# Patient Record
Sex: Female | Born: 1985
Health system: Southern US, Community
[De-identification: ages and names within clinical notes are randomized; demographics above are authoritative.]

## PROBLEM LIST (undated history)

## (undated) DIAGNOSIS — G43909 Migraine, unspecified, not intractable, without status migrainosus: Secondary | ICD-10-CM

## (undated) DIAGNOSIS — M1990 Unspecified osteoarthritis, unspecified site: Secondary | ICD-10-CM

## (undated) DIAGNOSIS — D649 Anemia, unspecified: Secondary | ICD-10-CM

## (undated) DIAGNOSIS — C801 Malignant (primary) neoplasm, unspecified: Secondary | ICD-10-CM

## (undated) DIAGNOSIS — F32A Depression, unspecified: Secondary | ICD-10-CM

## (undated) DIAGNOSIS — K529 Noninfective gastroenteritis and colitis, unspecified: Secondary | ICD-10-CM

## (undated) DIAGNOSIS — E119 Type 2 diabetes mellitus without complications: Secondary | ICD-10-CM

## (undated) DIAGNOSIS — F419 Anxiety disorder, unspecified: Secondary | ICD-10-CM

## (undated) DIAGNOSIS — T7840XA Allergy, unspecified, initial encounter: Secondary | ICD-10-CM

## (undated) HISTORY — DX: Allergy, unspecified, initial encounter: T78.40XA

## (undated) HISTORY — PX: TUBAL LIGATION: SHX77

## (undated) HISTORY — DX: Anxiety disorder, unspecified: F41.9

## (undated) HISTORY — DX: Type 2 diabetes mellitus without complications: E11.9

## (undated) HISTORY — DX: Depression, unspecified: F32.A

## (undated) HISTORY — DX: Anemia, unspecified: D64.9

## (undated) HISTORY — PX: DILATION AND CURETTAGE OF UTERUS: SHX78

## (undated) HISTORY — DX: Unspecified osteoarthritis, unspecified site: M19.90

## (undated) HISTORY — PX: HERNIA REPAIR: SHX51

---

## 2004-04-24 ENCOUNTER — Inpatient Hospital Stay (HOSPITAL_COMMUNITY): Admission: AD | Admit: 2004-04-24 | Discharge: 2004-04-24 | Payer: Self-pay | Admitting: Obstetrics and Gynecology

## 2004-04-30 ENCOUNTER — Ambulatory Visit: Payer: Self-pay

## 2004-09-23 ENCOUNTER — Emergency Department: Payer: Self-pay | Admitting: Emergency Medicine

## 2004-12-06 ENCOUNTER — Emergency Department: Payer: Self-pay | Admitting: Emergency Medicine

## 2005-01-29 ENCOUNTER — Emergency Department: Payer: Self-pay | Admitting: Internal Medicine

## 2005-06-02 DIAGNOSIS — N879 Dysplasia of cervix uteri, unspecified: Secondary | ICD-10-CM

## 2005-06-02 HISTORY — PX: LEEP: SHX91

## 2005-06-02 HISTORY — DX: Dysplasia of cervix uteri, unspecified: N87.9

## 2005-11-19 ENCOUNTER — Ambulatory Visit: Payer: Self-pay | Admitting: Family Medicine

## 2006-01-12 ENCOUNTER — Emergency Department: Payer: Self-pay | Admitting: Emergency Medicine

## 2006-01-19 ENCOUNTER — Emergency Department: Payer: Self-pay | Admitting: Emergency Medicine

## 2006-02-26 ENCOUNTER — Ambulatory Visit: Payer: Self-pay | Admitting: Unknown Physician Specialty

## 2008-05-03 ENCOUNTER — Emergency Department: Payer: Self-pay | Admitting: Emergency Medicine

## 2008-07-09 ENCOUNTER — Emergency Department: Payer: Self-pay | Admitting: Emergency Medicine

## 2012-04-12 ENCOUNTER — Emergency Department: Payer: Self-pay | Admitting: Emergency Medicine

## 2012-04-21 DIAGNOSIS — M545 Low back pain, unspecified: Secondary | ICD-10-CM | POA: Insufficient documentation

## 2012-04-21 DIAGNOSIS — M533 Sacrococcygeal disorders, not elsewhere classified: Secondary | ICD-10-CM | POA: Insufficient documentation

## 2012-09-15 ENCOUNTER — Emergency Department: Payer: Self-pay | Admitting: Emergency Medicine

## 2012-09-15 LAB — URINALYSIS, COMPLETE
Bilirubin,UR: NEGATIVE
Blood: NEGATIVE
Glucose,UR: NEGATIVE mg/dL (ref 0–75)
Ketone: NEGATIVE
Nitrite: NEGATIVE
Ph: 8 (ref 4.5–8.0)
Protein: NEGATIVE
RBC,UR: 1 /HPF (ref 0–5)
Specific Gravity: 1.024 (ref 1.003–1.030)
Squamous Epithelial: 10
WBC UR: 4 /HPF (ref 0–5)

## 2012-09-15 LAB — HCG, QUANTITATIVE, PREGNANCY: Beta Hcg, Quant.: 134722 m[IU]/mL — ABNORMAL HIGH

## 2013-01-17 ENCOUNTER — Encounter: Payer: Self-pay | Admitting: Maternal and Fetal Medicine

## 2013-02-09 ENCOUNTER — Observation Stay: Payer: Self-pay

## 2013-02-09 LAB — URINALYSIS, COMPLETE
Bilirubin,UR: NEGATIVE
Blood: NEGATIVE
Glucose,UR: NEGATIVE mg/dL (ref 0–75)
Ketone: NEGATIVE
Nitrite: NEGATIVE
Ph: 6 (ref 4.5–8.0)
Protein: NEGATIVE
RBC,UR: <1 /HPF (ref 0–5)
Specific Gravity: 1.018 (ref 1.003–1.030)
Squamous Epithelial: 5
WBC UR: 2 /HPF (ref 0–5)

## 2013-02-09 LAB — FETAL FIBRONECTIN
Appearance: NORMAL
Fetal Fibronectin: NEGATIVE

## 2013-02-09 LAB — GC/CHLAMYDIA PROBE AMP

## 2013-02-10 ENCOUNTER — Ambulatory Visit: Payer: Self-pay | Admitting: Obstetrics and Gynecology

## 2013-04-06 ENCOUNTER — Emergency Department: Payer: Self-pay | Admitting: Emergency Medicine

## 2013-04-19 ENCOUNTER — Observation Stay: Payer: Self-pay | Admitting: Obstetrics and Gynecology

## 2013-04-19 LAB — PIH PROFILE
Anion Gap: 9 (ref 7–16)
BUN: 4 mg/dL — ABNORMAL LOW (ref 7–18)
Calcium, Total: 8.3 mg/dL — ABNORMAL LOW (ref 8.5–10.1)
Chloride: 106 mmol/L (ref 98–107)
Co2: 22 mmol/L (ref 21–32)
Creatinine: 0.53 mg/dL — ABNORMAL LOW (ref 0.60–1.30)
EGFR (African American): >60
EGFR (Non-African Amer.): >60
Glucose: 77 mg/dL (ref 65–99)
HCT: 30.7 % — ABNORMAL LOW (ref 35.0–47.0)
HGB: 10 g/dL — ABNORMAL LOW (ref 12.0–16.0)
MCH: 25 pg — ABNORMAL LOW (ref 26.0–34.0)
MCHC: 32.4 g/dL (ref 32.0–36.0)
MCV: 77 fL — ABNORMAL LOW (ref 80–100)
Osmolality: 270 (ref 275–301)
Platelet: 150 10*3/uL (ref 150–440)
Potassium: 3.8 mmol/L (ref 3.5–5.1)
RBC: 3.99 10*6/uL (ref 3.80–5.20)
RDW: 16 % — ABNORMAL HIGH (ref 11.5–14.5)
SGOT(AST): 17 U/L (ref 15–37)
Sodium: 137 mmol/L (ref 136–145)
Uric Acid: 4 mg/dL (ref 2.6–6.0)
WBC: 10.1 10*3/uL (ref 3.6–11.0)

## 2013-04-19 LAB — PROTEIN / CREATININE RATIO, URINE
Creatinine, Urine: 22.5 mg/dL — ABNORMAL LOW (ref 30.0–125.0)
Protein, Random Urine: 8 mg/dL (ref 0–12)
Protein/Creat. Ratio: 356 mg/gCREAT — ABNORMAL HIGH (ref 0–200)

## 2013-04-23 ENCOUNTER — Inpatient Hospital Stay: Payer: Self-pay

## 2013-04-23 LAB — CBC WITH DIFFERENTIAL/PLATELET
Basophil #: 0.1 10*3/uL (ref 0.0–0.1)
Basophil %: 0.5 %
Eosinophil #: 0.1 10*3/uL (ref 0.0–0.7)
Eosinophil %: 0.4 %
HCT: 33.2 % — ABNORMAL LOW (ref 35.0–47.0)
HGB: 10.6 g/dL — ABNORMAL LOW (ref 12.0–16.0)
Lymphocyte #: 2.2 10*3/uL (ref 1.0–3.6)
Lymphocyte %: 13.9 %
MCH: 24.5 pg — ABNORMAL LOW (ref 26.0–34.0)
MCHC: 31.9 g/dL — ABNORMAL LOW (ref 32.0–36.0)
MCV: 77 fL — ABNORMAL LOW (ref 80–100)
Monocyte #: 1.3 x10 3/mm — ABNORMAL HIGH (ref 0.2–0.9)
Monocyte %: 8.1 %
Neutrophil #: 12 10*3/uL — ABNORMAL HIGH (ref 1.4–6.5)
Neutrophil %: 77.1 %
Platelet: 199 10*3/uL (ref 150–440)
RBC: 4.33 10*6/uL (ref 3.80–5.20)
RDW: 16.4 % — ABNORMAL HIGH (ref 11.5–14.5)
WBC: 15.6 10*3/uL — ABNORMAL HIGH (ref 3.6–11.0)

## 2013-04-25 LAB — HEMATOCRIT: HCT: 26.9 % — ABNORMAL LOW (ref 35.0–47.0)

## 2013-04-26 LAB — URINALYSIS, COMPLETE
Bacteria: NONE SEEN
Bilirubin,UR: NEGATIVE
Blood: NEGATIVE
Glucose,UR: NEGATIVE mg/dL (ref 0–75)
Ketone: NEGATIVE
Leukocyte Esterase: NEGATIVE
Nitrite: NEGATIVE
Ph: 6 (ref 4.5–8.0)
Protein: NEGATIVE
RBC,UR: <1 /HPF (ref 0–5)
Specific Gravity: 1.009 (ref 1.003–1.030)
Squamous Epithelial: 2
WBC UR: 3 /HPF (ref 0–5)

## 2013-04-26 LAB — PATHOLOGY REPORT

## 2013-04-28 LAB — URINE CULTURE

## 2014-05-08 ENCOUNTER — Ambulatory Visit: Payer: Self-pay

## 2014-05-08 LAB — CBC WITH DIFFERENTIAL/PLATELET
Basophil #: 0.1 10*3/uL (ref 0.0–0.1)
Basophil %: 0.8 %
Eosinophil #: 0 10*3/uL (ref 0.0–0.7)
Eosinophil %: 0.3 %
HCT: 39.4 % (ref 35.0–47.0)
HGB: 12.5 g/dL (ref 12.0–16.0)
Lymphocyte #: 1.3 10*3/uL (ref 1.0–3.6)
Lymphocyte %: 12.6 %
MCH: 25.9 pg — ABNORMAL LOW (ref 26.0–34.0)
MCHC: 31.8 g/dL — ABNORMAL LOW (ref 32.0–36.0)
MCV: 81 fL (ref 80–100)
Monocyte #: 0.5 x10 3/mm (ref 0.2–0.9)
Monocyte %: 4.4 %
Neutrophil #: 8.4 10*3/uL — ABNORMAL HIGH (ref 1.4–6.5)
Neutrophil %: 81.9 %
Platelet: 247 10*3/uL (ref 150–440)
RBC: 4.84 10*6/uL (ref 3.80–5.20)
RDW: 14.6 % — ABNORMAL HIGH (ref 11.5–14.5)
WBC: 10.3 10*3/uL (ref 3.6–11.0)

## 2014-05-08 LAB — URINALYSIS, COMPLETE
Bilirubin,UR: NEGATIVE
Blood: NEGATIVE
Glucose,UR: NEGATIVE
Leukocyte Esterase: NEGATIVE
Nitrite: NEGATIVE
Ph: 8.5 (ref 5.0–8.0)
RBC,UR: NONE SEEN /HPF (ref 0–5)
Specific Gravity: 1.02 (ref 1.000–1.030)

## 2014-05-08 LAB — COMPREHENSIVE METABOLIC PANEL
Albumin: 4 g/dL (ref 3.4–5.0)
Alkaline Phosphatase: 52 U/L
Anion Gap: 9 (ref 7–16)
BUN: 12 mg/dL (ref 7–18)
Bilirubin,Total: 0.4 mg/dL (ref 0.2–1.0)
Calcium, Total: 9.1 mg/dL (ref 8.5–10.1)
Chloride: 105 mmol/L (ref 98–107)
Co2: 28 mmol/L (ref 21–32)
Creatinine: 0.86 mg/dL (ref 0.60–1.30)
EGFR (African American): >60
EGFR (Non-African Amer.): >60
Glucose: 116 mg/dL — ABNORMAL HIGH (ref 65–99)
Osmolality: 284 (ref 275–301)
Potassium: 4 mmol/L (ref 3.5–5.1)
SGOT(AST): 15 U/L (ref 15–37)
SGPT (ALT): 14 U/L
Sodium: 142 mmol/L (ref 136–145)
Total Protein: 7.7 g/dL (ref 6.4–8.2)

## 2014-05-08 LAB — AMYLASE: Amylase: 39 U/L (ref 25–115)

## 2014-05-08 LAB — OCCULT BLOOD X 1 CARD TO LAB, STOOL: Occult Blood, Feces: POSITIVE

## 2014-05-08 LAB — PREGNANCY, URINE: Pregnancy Test, Urine: NEGATIVE m[IU]/mL

## 2014-05-08 LAB — LIPASE, BLOOD: Lipase: 114 U/L (ref 73–393)

## 2014-09-22 NOTE — Op Note (Signed)
PATIENT NAME:  Desiree Green, NOWACZYK MR#:  [id number] DATE OF BIRTH:  08-Nov-1985  DATE OF PROCEDURE:  04/25/2013  PREOPERATIVE DIAGNOSIS: Desire for permanent sterility.   POSTOPERATIVE DIAGNOSIS: Desire for permanent sterility.   PROCEDURE: Postpartum bilateral partial salpingectomy.   SURGEON: Barnett Applebaum, M.D.   ANESTHESIA: General.   ESTIMATED BLOOD LOSS: Minimal.   COMPLICATIONS: None.   FINDINGS: Normal fallopian tubes and postpartum uterus.   DISPOSITION: To recovery room stable.   TECHNIQUE: The patient was prepped and draped in the usual sterile fashion after adequate anesthesia is obtained in the supine position on the operating room table. Marcaine 0.5% is used to anesthetize the area just inferior to the umbilicus followed by a curvilinear infraumbilical skin incision using a scalpel. The rectus fascia is identified and tented anteriorly and incised using Mayo scissors. The peritoneum is also penetrated. There are no adhesions or bowel in this area. Retractors are placed.   The right fallopian tube is grasped in the midportion with a Babcock clamp and followed out to the fimbria. Once visualized, then a loop is tied with 2 Vicryl sutures, excised, and cauterized. The left fallopian tube is grasped in the midportion with Babcock clamps and followed out to its fimbria and once identified a loop is tied with 2 Vicryl sutures, excised, and cauterized. Excellent hemostasis is noted. The rectus fascia is closed with 0 Vicryl suture. Subcutaneous tissues are irrigated and hemostasis is assured using electrocautery. Skin is closed with 4-0 Vicryl suture in a subcuticular fashion followed by Dermabond. The patient goes to the recovery room in stable condition. All sponge, instrument, and needle counts are correct.   ____________________________ R. Barnett Applebaum, MD rph:aw D: 04/25/2013 11:54:01 ET T: 04/25/2013 12:15:15 ET JOB#: [id number]  cc: Glean Salen, MD, <Dictator> Gae Dry  MD ELECTRONICALLY SIGNED 04/25/2013 19:28

## 2014-10-10 NOTE — H&P (Signed)
L&D Evaluation:  History Expanded:  HPI 29 yo G5P1031 at 39 weeks 3 days who had her membranes stripped yesterday and who presetns with painful ctx she was 3-4 in the ofice and is the same now but is breathing through them unknown tdap, not documented in chart, gbs neg, O pos, VI, RUBI, neg NT screen, opt desires btl and has signed pp tubal papers,   Gravida 5   Term 1   PreTerm 0   Abortion 3   Living 1   Blood Type (Maternal) O positive   Group B Strep Results Maternal (Result >5wks must be treated as unknown) negative   Maternal HIV Negative   Maternal Syphilis Ab Nonreactive   Maternal Varicella Immune   Rubella Results (Maternal) immune   Maternal T-Dap Unknown   [facility] 27-Apr-2013   Presents with contractions   Patient's Medical History No Chronic Illness   Patient's Surgical History D&C  LEEP   Medications Pre Natal Vitamins  zyrtec   Allergies NKDA   Social History none   Family History Non-Contributory   ROS:  ROS All systems were reviewed.  HEENT, CNS, GI, GU, Respiratory, CV, Renal and Musculoskeletal systems were found to be normal.   Exam:  Vital Signs stable   Urine Protein not completed   General no apparent distress   Mental Status clear   Chest clear   Heart normal sinus rhythm   Abdomen gravid, tender with contractions   Estimated Fetal Weight Average for gestational age   Fetal Position v   Fundal Height term   Back no CVAT   Edema no edema   Reflexes 1+   Pelvic no external lesions   Mebranes Intact, some bloody show seen   Description clear   FHT normal rate with no decels   FHT Description cat 1 no decels   Fetal Heart Rate 140   Ucx regular   Skin dry   Lymph no lymphadenopathy   Impression:  Impression early labor   Plan:  Plan monitor contractions and for cervical change   Comments if changes cervix will allow to be admitted and get epidural and deliver.   Follow Up Appointment need to  schedule. in 6 weeks   Electronic Signatures: Erik Obey (MD)  (Signed [phone] 20:53)  Authored: L&D Evaluation   Last Updated: 22-Nov-14 20:53 by Erik Obey (MD)

## 2014-10-10 NOTE — H&P (Signed)
L&D Evaluation:  History:  HPI 29 yo G5P1031 at [redacted]w[redacted]d by [facility] of 04/27/2013 presenting from clinic for [place] work up.  Initial BP 134/86 repeat 140/90, no proteinuria, weight stable since 04/08/2013.  Reported HA, seeing spots, nausea, and dizzyness.  Third Trimester BP have ranged from 98-130 / 52-60.  No LOF, no VB, positive fetal mvoement   Presents with PIH evaluation   Patient's Medical History No Chronic Illness   Patient's Surgical History none   Medications Pre Natal Vitamins   Allergies NKDA, Benadryl. Ibuprofen   Social History none   Family History Non-Contributory   ROS:  ROS All systems were reviewed.  HEENT, CNS, GI, GU, Respiratory, CV, Renal and Musculoskeletal systems were found to be normal.   Exam:  Vital Signs stable   General no apparent distress   Mental Status clear   Impression:  Impression evaluation for PIH   Plan:  Comments 1) PIH evaluation - normotensive here on L&D. Alex labs sent  2) Fetus - category I tracing     - umbilical cord cyst evaluted by DP (benign umbilical cord cyst)  3) PNL - A+ / ABSC neg / RI / VZI / HIV neg / HBsAg neg / RPR NR / Negative 1st trimester screen / Negative MSAFP / 1-hr 138  4) Immunizations - TDAP 03/31/2013  5) Contraception - Desires BTL, papers signed 9/17   Electronic Signatures for Addendum Section:  Dorthula Nettles (MD) (Signed Addendum [phone] 19:28)  3/70/-2 unchanged from clinic.  PreE labs normal protein/Cr ratio slightly elevated.  Home 24-hr urine collection to drop of thursday with follow up in place with myself the next day.  BP's normotensive, category I tracing, HA relieved with po Tylenol   Electronic Signatures: Dorthula Nettles (MD)  (Signed [phone] 19:12)  Authored: L&D Evaluation   Last Updated: 18-Nov-14 19:28 by Dorthula Nettles (MD)

## 2014-10-10 NOTE — H&P (Signed)
L&D Evaluation:  History:  HPI 29 year old G5 P21 with EDC=04/27/2013 presented at 29 weeks with lower abdominal pain that started last night. There is a constant pain in the mid and LLQ of her abdomen that will intermittently worsen and "shoot" to her back. She rates this pain 7/10. Has had some urinary frequency, but no dysuria, unusual discharge, bleeding, or LOF. Last IC > 24 hours ago. Baby active. Cervix on arrival 1/thick(50%)/ -2 about 2 1/2 hours ago. Denies hx of urolithiasis. No diarrhea/nausea or vomiting.  PNC remarkable for a first trimester ultrasound confirming dates, a negative First Trimester test, a neg MSAFP, and a normal anatomy scan. Hx of a LEEP in 2007 and a subsequent term SVD in 2011.   Presents with abdominal pain, back pain   Patient's Medical History No Chronic Illness   Patient's Surgical History D&C  LEEP   Medications Pre Natal Vitamins  Zyrtec prn   Allergies Benadryl and Motrin   Social History none   Family History Fm HX of ovarian and breast cancer   ROS:  ROS see HPI   Exam:  Vital Signs stable  111/67, 98.7-86-18   Urine Protein negative dipstick, sp grav=1.018, trace leuks   General appears uncomfortable/ fidgety in bed   Mental Status clear   Chest clear   Heart normal sinus rhythm, no murmur/gallop/rubs   Abdomen gravid, tenderness along left border of uterus and LUS. Fundus NT.  BS WNL   Fetal Position LOT on ultrasound   Back no CVAT   Edema no edema   Pelvic no external lesions, 1/50%/-3 and ballotable   Mebranes Intact   FHT 140s with accels to 160s   FHT Description Cat 1   Ucx Will have runs of contractions q3-6 min then space out   Skin dry   Other FFN was negative   Impression:  Impression IUP at 29 weeks with threatened preterm labor   Plan:  Plan EFM/NST, monitor contractions and for cervical change, Terbutaline 0.25 mgm SQ for tocolysis. urine culture. GC/Chlamydia NAA.   Electronic  Signatures: Karene Fry (CNM)  (Signed 12-Sep-14 13:29)  Authored: L&D Evaluation   Last Updated: 12-Sep-14 13:29 by Karene Fry (CNM)

## 2016-02-19 ENCOUNTER — Emergency Department: Payer: BLUE CROSS/BLUE SHIELD

## 2016-02-19 ENCOUNTER — Emergency Department
Admission: EM | Admit: 2016-02-19 | Discharge: 2016-02-19 | Disposition: A | Discharge status: Home / Self Care | Payer: BLUE CROSS/BLUE SHIELD | Attending: Emergency Medicine | Admitting: Emergency Medicine

## 2016-02-19 ENCOUNTER — Encounter: Payer: Self-pay | Admitting: Emergency Medicine

## 2016-02-19 DIAGNOSIS — Y939 Activity, unspecified: Secondary | ICD-10-CM | POA: Diagnosis not present

## 2016-02-19 DIAGNOSIS — Y999 Unspecified external cause status: Secondary | ICD-10-CM | POA: Insufficient documentation

## 2016-02-19 DIAGNOSIS — F17210 Nicotine dependence, cigarettes, uncomplicated: Secondary | ICD-10-CM | POA: Insufficient documentation

## 2016-02-19 DIAGNOSIS — R079 Chest pain, unspecified: Secondary | ICD-10-CM | POA: Insufficient documentation

## 2016-02-19 DIAGNOSIS — X58XXXA Exposure to other specified factors, initial encounter: Secondary | ICD-10-CM | POA: Diagnosis not present

## 2016-02-19 DIAGNOSIS — Y929 Unspecified place or not applicable: Secondary | ICD-10-CM | POA: Insufficient documentation

## 2016-02-19 DIAGNOSIS — M542 Cervicalgia: Secondary | ICD-10-CM | POA: Diagnosis present

## 2016-02-19 DIAGNOSIS — S161XXA Strain of muscle, fascia and tendon at neck level, initial encounter: Secondary | ICD-10-CM | POA: Diagnosis not present

## 2016-02-19 LAB — CBC
HCT: 37.2 % (ref 35.0–47.0)
Hemoglobin: 12.5 g/dL (ref 12.0–16.0)
MCH: 27.5 pg (ref 26.0–34.0)
MCHC: 33.6 g/dL (ref 32.0–36.0)
MCV: 81.7 fL (ref 80.0–100.0)
Platelets: 210 10*3/uL (ref 150–440)
RBC: 4.56 MIL/uL (ref 3.80–5.20)
RDW: 14.6 % — ABNORMAL HIGH (ref 11.5–14.5)
WBC: 5.3 10*3/uL (ref 3.6–11.0)

## 2016-02-19 LAB — BASIC METABOLIC PANEL
Anion gap: 5 (ref 5–15)
BUN: 13 mg/dL (ref 6–20)
CO2: 25 mmol/L (ref 22–32)
Calcium: 8.6 mg/dL — ABNORMAL LOW (ref 8.9–10.3)
Chloride: 110 mmol/L (ref 101–111)
Creatinine, Ser: 0.69 mg/dL (ref 0.44–1.00)
GFR calc Af Amer: >60 mL/min (ref >60)
GFR calc non Af Amer: >60 mL/min (ref >60)
Glucose, Bld: 116 mg/dL — ABNORMAL HIGH (ref 65–99)
Potassium: 4.1 mmol/L (ref 3.5–5.1)
Sodium: 140 mmol/L (ref 135–145)

## 2016-02-19 LAB — TROPONIN I: Troponin I: <0.03 ng/mL (ref <0.03)

## 2016-02-19 MED ORDER — IBUPROFEN 600 MG PO TABS
600.0000 mg | ORAL_TABLET | Freq: Once | ORAL | Status: DC
  Filled 2016-02-19: qty 1

## 2016-02-19 MED ORDER — CYCLOBENZAPRINE HCL 5 MG PO TABS: 5.0000 mg | ORAL_TABLET | Freq: Three times a day (TID) | ORAL | 0 refills | Status: DC | PRN

## 2016-02-19 MED ORDER — IBUPROFEN 800 MG PO TABS: 800.0000 mg | ORAL_TABLET | Freq: Three times a day (TID) | ORAL | 0 refills | Status: DC | PRN

## 2016-02-19 MED ORDER — CYCLOBENZAPRINE HCL 10 MG PO TABS
10.0000 mg | ORAL_TABLET | Freq: Once | ORAL | Status: AC
  Administered 2016-02-19: 10 mg via ORAL
  Filled 2016-02-19: qty 1

## 2016-02-19 NOTE — ED Provider Notes (Signed)
[facility] Emergency Department Provider Note  Time seen: 11:19 AM  I have reviewed the triage vital signs and the nursing notes.   HISTORY  Chief Complaint Chest Pain    HPI Desiree Green is a 30 y.o. female with no past medical history who presents to the emergency department with neck pain and chest pain. According to the patient for the past several days she has had progressively worsening neck pain which she describes as the left side of her neck going down her left shoulder. States it hurts to move her head or to push on these muscles. Describes his pain as moderate. Patient also states starting this morning she has been having intermittent central chest pain, states it lasts for several seconds up to a minute and then goes away. Denies any chest pain currently. Denies any shortness of breath nausea or diaphoresis. Denies any leg pain or swelling. Does not use any form of estrogen or birth control.  History reviewed. No pertinent past medical history.  There are no active problems to display for this patient.   History reviewed. No pertinent surgical history.  Prior to Admission medications   Not on File    No Known Allergies  History reviewed. No pertinent family history.  Social History Social History  Substance Use Topics  . Smoking status: Current Every Day Smoker    Packs/day: 1.00    Types: Cigarettes  . Smokeless tobacco: Never Used  . Alcohol use Not on file    Review of Systems Constitutional: Negative for fever. Cardiovascular: Positive for intermittent chest pain Respiratory: Negative for shortness of breath. Gastrointestinal: Negative for abdominal pain Musculoskeletal: Positive for left-sided neck pain/left trapezius pain Skin: Negative for rash. Neurological: Negative for headache 10-point ROS otherwise negative.  ____________________________________________   PHYSICAL EXAM:  VITAL SIGNS: ED Triage Vitals   Enc Vitals Group     BP 02/19/16 1013 120/78     Pulse Rate 02/19/16 1013 88     Resp 02/19/16 1013 16     Temp 02/19/16 1013 98.2 F (36.8 C)     Temp Source 02/19/16 1013 Oral     SpO2 02/19/16 1013 99 %     Weight --      Height --      Head Circumference --      Peak Flow --      Pain Score 02/19/16 1012 10     Pain Loc --      Pain Edu? --      Excl. in [place]? --     Constitutional: Alert and oriented. Well appearing and in no distress. Eyes: Normal exam ENT   Head: Normocephalic and atraumatic.   Mouth/Throat: Mucous membranes are moist. Cardiovascular: Normal rate, regular rhythm. No murmur Respiratory: Normal respiratory effort without tachypnea nor retractions. Breath sounds are clear  Gastrointestinal: Soft and nontender. No distention.  Musculoskeletal: Moderate left neck tenderness palpation in the cervical paraspinal muscles and down the left trapezius. No midline tenderness. Neurologic:  Normal speech and language. No gross focal neurologic deficits Skin:  Skin is warm, dry and intact.  Psychiatric: Mood and affect are normal.   ____________________________________________    EKG  EKG didn't interpreted by myself shows normal sinus rhythm at 64 bpm, narrow QRS, normal axis, normal intervals, no ST changes per normal EKG.  ____________________________________________    RADIOLOGY  Chest x-ray negative  ____________________________________________   INITIAL IMPRESSION / ASSESSMENT AND PLAN / ED COURSE  Pertinent labs &  imaging results that were available during my care of the patient were reviewed by me and considered in my medical decision making (see chart for details).  Patient presents the emergency department with intermittent chest pain also complaining of 2-3 days of left-sided neck pain worse with movement or pushing on the area. Patient's labs within normal limits. Troponin is negative. X-rays negative. EKG is normal. Patient's neck pain  is very consistent with muscular skeletal pain. We will treat with Flexeril, ibuprofen and have the patient follow up with her primary care doctor. Patient is agreeable to this plan.  ____________________________________________   FINAL CLINICAL IMPRESSION(S) / ED DIAGNOSES  Musculoskeletal cervical strain Chest pain    Harvest Dark, MD 02/19/16 1123

## 2016-02-19 NOTE — Discharge Instructions (Signed)
You have been seen in the emergency department today for chest pain. Your workup has shown normal results. As we discussed please follow-up with your primary care physician in the next 1-2 days for recheck. Return to the emergency department for any further chest pain, trouble breathing, or any other symptom personally concerning to yourself. °

## 2016-02-19 NOTE — ED Triage Notes (Signed)
Pt states that she began having right sided chest pain that radiates to her left arm.  Also having pain down neck.  Says it hurts to breathe.  C/o dizziness, diaphoresis, sob, N/V x 2 days.  Hx of smoking.

## 2016-02-19 NOTE — ED Notes (Signed)
Pt in with complaints of left side neck pain since yesterday, being unable to turn head to left due to pain.  Pt also reports intermittent central chest pain with first episode occurring yesterday, becoming short of breath, dizzy, nauseas w/ pain.  Pt A/Ox4, vitals WDL, no immediate distress noted.  MD at bedside.

## 2016-04-06 ENCOUNTER — Encounter: Payer: Self-pay | Admitting: *Deleted

## 2016-04-06 ENCOUNTER — Emergency Department
Admission: EM | Admit: 2016-04-06 | Discharge: 2016-04-06 | Disposition: A | Discharge status: Home / Self Care | Payer: BLUE CROSS/BLUE SHIELD | Attending: Emergency Medicine | Admitting: Emergency Medicine

## 2016-04-06 DIAGNOSIS — J020 Streptococcal pharyngitis: Secondary | ICD-10-CM | POA: Diagnosis not present

## 2016-04-06 DIAGNOSIS — R509 Fever, unspecified: Secondary | ICD-10-CM | POA: Diagnosis present

## 2016-04-06 DIAGNOSIS — F17210 Nicotine dependence, cigarettes, uncomplicated: Secondary | ICD-10-CM | POA: Diagnosis not present

## 2016-04-06 DIAGNOSIS — Z79899 Other long term (current) drug therapy: Secondary | ICD-10-CM | POA: Diagnosis not present

## 2016-04-06 LAB — POCT RAPID STREP A: Streptococcus, Group A Screen (Direct): POSITIVE — AB

## 2016-04-06 MED ORDER — AMOXICILLIN 875 MG PO TABS: 875.0000 mg | ORAL_TABLET | Freq: Two times a day (BID) | ORAL | 0 refills | Status: DC

## 2016-04-06 MED ORDER — ONDANSETRON 4 MG PO TBDP
4.0000 mg | ORAL_TABLET | Freq: Once | ORAL | Status: AC
  Administered 2016-04-06: 4 mg via ORAL
  Filled 2016-04-06: qty 1

## 2016-04-06 MED ORDER — ONDANSETRON 4 MG PO TBDP: 4.0000 mg | ORAL_TABLET | Freq: Three times a day (TID) | ORAL | 0 refills | Status: DC | PRN

## 2016-04-06 MED ORDER — ACETAMINOPHEN 500 MG PO TABS
1000.0000 mg | ORAL_TABLET | Freq: Once | ORAL | Status: AC
  Administered 2016-04-06: 1000 mg via ORAL
  Filled 2016-04-06: qty 2

## 2016-04-06 NOTE — ED Triage Notes (Signed)
Pt complains of fever,aches, chills, nausea, vomiting since yesterday, mask placed on pt in triage

## 2016-04-06 NOTE — Discharge Instructions (Signed)
Increase fluids. Tylenol if needed for throat pain and fever. Begin taking Amoxil 875 twice a day for 10 days. You need completely finished this medication even if your throat is feeling better. Zofran if needed for nausea. Follow-up with your primary care doctor if any continued or throat problems.

## 2016-04-06 NOTE — ED Provider Notes (Signed)
[facility] Emergency Department Provider Note  ____________________________________________   First MD Initiated Contact with Patient 04/06/16 1038     (approximate)  I have reviewed the triage vital signs and the nursing notes.   HISTORY  Chief Complaint Fever and Nausea   HPI Desiree Green is a 30 y.o. female patient is here with complaint of fever, aches, chills, nausea and vomiting since yesterday. Patient states that symptoms came gradually. She states that her child had strep last week. She complains also of ear pain. She has an occasional nonproductive cough. Patient states that she took over-the-counter medication at approximately 4 AM. She complains of chills and sweats.Patient denies diarrhea. She rates her pain as a 5/10.   History reviewed. No pertinent past medical history.  There are no active problems to display for this patient.   History reviewed. No pertinent surgical history.  Prior to Admission medications   Medication Sig Start Date End Date Taking? Authorizing Provider  amoxicillin (AMOXIL) 875 MG tablet Take 1 tablet (875 mg total) by mouth 2 (two) times daily. 04/06/16   Johnn Hai, PA-C  cyclobenzaprine (FLEXERIL) 5 MG tablet Take 1 tablet (5 mg total) by mouth 3 (three) times daily as needed for muscle spasms. 02/19/16   Harvest Dark, MD  ibuprofen (ADVIL,MOTRIN) 800 MG tablet Take 1 tablet (800 mg total) by mouth every 8 (eight) hours as needed. 02/19/16   Harvest Dark, MD  ondansetron (ZOFRAN ODT) 4 MG disintegrating tablet Take 1 tablet (4 mg total) by mouth every 8 (eight) hours as needed for nausea or vomiting. 04/06/16   Johnn Hai, PA-C    Allergies Ibuprofen  No family history on file.  Social History Social History  Substance Use Topics  . Smoking status: Current Every Day Smoker    Packs/day: 1.00    Types: Cigarettes  . Smokeless tobacco: Never Used  . Alcohol use Not on file     Review of Systems Constitutional: Positive fever/chills Eyes: No visual changes. ENT: Positive sore throat. Positive ear pain. Cardiovascular: Denies chest pain. Respiratory: Denies shortness of breath. Gastrointestinal: No abdominal pain.  Positive nausea, positive vomiting.  No diarrhea.  No constipation. Genitourinary: Negative for dysuria. Musculoskeletal: Negative for back pain. Skin: Negative for rash. Neurological: Negative for headaches, focal weakness or numbness.  10-point ROS otherwise negative.  ____________________________________________   PHYSICAL EXAM:  VITAL SIGNS: ED Triage Vitals [04/06/16 1030]  Enc Vitals Group     BP (!) 121/50     Pulse Rate (!) 104     Resp 20     Temp (!) 102.6 F (39.2 C)     Temp Source Oral     SpO2 98 %     Weight      Height      Head Circumference      Peak Flow      Pain Score 5     Pain Loc      Pain Edu?      Excl. in [place]?     Constitutional: Alert and oriented. Well appearing and in no acute distress. Eyes: Conjunctivae are normal. PERRL. EOMI. Head: Atraumatic. Nose: No congestion/rhinnorhea.   EACs are clear bilaterally. Right TM slightly injected. Mouth/Throat: Mucous membranes are moist.  Oropharynx Erythematous without exudate. Neck: No stridor.   Hematological/Lymphatic/Immunilogical: Mild bilateral tender cervical lymphadenopathy. Cardiovascular: Normal rate, regular rhythm. Grossly normal heart sounds.  Good peripheral circulation. Respiratory: Normal respiratory effort.  No retractions. Lungs CTAB.  Gastrointestinal: Soft and nontender. No distention.  No CVA tenderness. Musculoskeletal: Moves upper and lower extremities without any difficulty. Normal gait was noted. Neurologic:  Normal speech and language. No gross focal neurologic deficits are appreciated. No gait instability. Skin:  Skin is warm, dry and intact. No rash noted. Psychiatric: Mood and affect are normal. Speech and behavior are  normal.  ____________________________________________   LABS (all labs ordered are listed, but only abnormal results are displayed)  Labs Reviewed  POCT RAPID STREP A - Abnormal; Notable for the following:       Result Value   Streptococcus, Group A Screen (Direct) POSITIVE (*)    All other components within normal limits    PROCEDURES  Procedure(s) performed: None  Procedures  Critical Care performed: No  ____________________________________________   INITIAL IMPRESSION / ASSESSMENT AND PLAN / ED COURSE  Pertinent labs & imaging results that were available during my care of the patient were reviewed by me and considered in my medical decision making (see chart for details).    Clinical Course    She was given Zofran while in the emergency room and began feeling better. She was also given Tylenol for her fever. Patient was made aware that her strep test is positive and she was placed on Zofran as needed for nausea and Amoxil 875 twice a day for 10 days. Patient is to follow-up with her primary care doctor if any continued problems. She was also given a note to remain out of work for 2 days.  ____________________________________________   FINAL CLINICAL IMPRESSION(S) / ED DIAGNOSES  Final diagnoses:  Strep pharyngitis  Fever in adult      NEW MEDICATIONS STARTED DURING THIS VISIT:  Discharge Medication List as of 04/06/2016 11:51 AM    START taking these medications   Details  amoxicillin (AMOXIL) 875 MG tablet Take 1 tablet (875 mg total) by mouth 2 (two) times daily., Starting Sun 04/06/2016, Print    ondansetron (ZOFRAN ODT) 4 MG disintegrating tablet Take 1 tablet (4 mg total) by mouth every 8 (eight) hours as needed for nausea or vomiting., Starting Sun 04/06/2016, Print         Note:  This document was prepared using Dragon voice recognition software and may include unintentional dictation errors.    Johnn Hai, PA-C 04/06/16 [place], MD 04/06/16 1530

## 2016-04-06 NOTE — ED Notes (Signed)
Provider notified about BP, water given and will recheck BP before d/c

## 2016-06-21 ENCOUNTER — Encounter: Payer: Self-pay | Admitting: Gynecology

## 2016-06-21 ENCOUNTER — Ambulatory Visit
Admission: EM | Admit: 2016-06-21 | Discharge: 2016-06-21 | Disposition: A | Discharge status: Home / Self Care | Payer: BLUE CROSS/BLUE SHIELD | Attending: Family Medicine | Admitting: Family Medicine

## 2016-06-21 DIAGNOSIS — J40 Bronchitis, not specified as acute or chronic: Secondary | ICD-10-CM

## 2016-06-21 HISTORY — DX: Migraine, unspecified, not intractable, without status migrainosus: G43.909

## 2016-06-21 LAB — RAPID INFLUENZA A&B ANTIGENS: Influenza A (ARMC): NEGATIVE

## 2016-06-21 LAB — PREGNANCY, URINE: PREG TEST UR: NEGATIVE

## 2016-06-21 LAB — RAPID INFLUENZA A&B ANTIGENS (ARMC ONLY): INFLUENZA B (ARMC): NEGATIVE

## 2016-06-21 MED ORDER — GUAIFENESIN-CODEINE 100-10 MG/5ML PO SYRP: 5.0000 mL | ORAL_SOLUTION | Freq: Three times a day (TID) | ORAL | 0 refills | Status: DC | PRN

## 2016-06-21 MED ORDER — AZITHROMYCIN 250 MG PO TABS: 250.0000 mg | ORAL_TABLET | Freq: Every day | ORAL | 0 refills | Status: DC

## 2016-06-21 MED ORDER — ALBUTEROL SULFATE HFA 108 (90 BASE) MCG/ACT IN AERS: 1.0000 | INHALATION_SPRAY | Freq: Four times a day (QID) | RESPIRATORY_TRACT | 0 refills | Status: DC | PRN

## 2016-06-21 NOTE — ED Triage Notes (Signed)
Per patient x 1 month URI / fever at home of 100.7 and cough.

## 2016-06-21 NOTE — ED Provider Notes (Signed)
CSN: [id number]     Arrival date & time 06/21/16  1433 History   None    Chief Complaint  Patient presents with  . Fever  . Cough  . URI   (Consider location/radiation/quality/duration/timing/severity/associated sxs/prior Treatment)  Cough  Cough characteristics:  Productive Sputum characteristics:  Green Severity:  Moderate Duration:  3 weeks Timing:  Constant Progression:  Worsening Chronicity:  New Smoker: yes   Ineffective treatments:  Cough suppressants Associated symptoms: chills and rhinorrhea     Past Medical History:  Diagnosis Date  . Migraine    Past Surgical History:  Procedure Laterality Date  . DILATION AND CURETTAGE OF UTERUS    . HERNIA REPAIR    . TUBAL LIGATION     No family history on file. Social History  Substance Use Topics  . Smoking status: Current Every Day Smoker    Packs/day: 1.00    Types: Cigarettes  . Smokeless tobacco: Never Used  . Alcohol use Not on file   OB History    No data available     Review of Systems  Constitutional: Positive for chills.  HENT: Positive for congestion, postnasal drip, rhinorrhea and sinus pressure.   Respiratory: Positive for cough.     Allergies  Ibuprofen  Home Medications   Prior to Admission medications   Medication Sig Start Date End Date Taking? Authorizing Provider  rizatriptan (MAXALT) 5 MG tablet Take 5 mg by mouth as needed for migraine. May repeat in 2 hours if needed   Yes Historical Provider, MD  albuterol (PROVENTIL HFA;VENTOLIN HFA) 108 (90 Base) MCG/ACT inhaler Inhale 1-2 puffs into the lungs every 6 (six) hours as needed for wheezing or shortness of breath. 06/21/16   Fabianna Keats, NP  amoxicillin (AMOXIL) 875 MG tablet Take 1 tablet (875 mg total) by mouth 2 (two) times daily. 04/06/16   Johnn Hai, PA-C  azithromycin (ZITHROMAX) 250 MG tablet Take 1 tablet (250 mg total) by mouth daily. Take first 2 tablets together, then 1 every day until finished. 06/21/16   Elverna Caffee, NP  cyclobenzaprine (FLEXERIL) 5 MG tablet Take 1 tablet (5 mg total) by mouth 3 (three) times daily as needed for muscle spasms. 02/19/16   Harvest Dark, MD  guaiFENesin-codeine [facility]) 100-10 MG/5ML syrup Take 5 mLs by mouth 3 (three) times daily as needed for cough. 06/21/16   Dwon Sky, NP  ibuprofen (ADVIL,MOTRIN) 800 MG tablet Take 1 tablet (800 mg total) by mouth every 8 (eight) hours as needed. 02/19/16   Harvest Dark, MD  ondansetron (ZOFRAN ODT) 4 MG disintegrating tablet Take 1 tablet (4 mg total) by mouth every 8 (eight) hours as needed for nausea or vomiting. 04/06/16   Johnn Hai, PA-C   Meds Ordered and Administered this Visit  Medications - No data to display  BP 120/69 (BP Location: Left Arm)   Pulse 93   Temp 99.3 F (37.4 C) (Oral)   Resp 16   Ht 5\' 4"  (1.626 m)   Wt 170 lb (77.1 kg)   LMP 05/11/2016   SpO2 100%   BMI 29.18 kg/m  No data found.   Physical Exam  Constitutional: She appears well-developed and well-nourished.  HENT:  Nose: Mucosal edema and rhinorrhea present. Right sinus exhibits frontal sinus tenderness. Left sinus exhibits frontal sinus tenderness.  Mouth/Throat: Oropharynx is clear and moist.  Cardiovascular: Normal rate and regular rhythm.   Pulmonary/Chest: Effort normal and breath sounds normal. No respiratory distress. She has no  decreased breath sounds. She has no wheezes. She has no rhonchi. She has no rales.    Urgent Care Course     Procedures (including critical care time)  Labs Review Labs Reviewed  RAPID INFLUENZA A&B ANTIGENS ([facility] ONLY)  PREGNANCY, URINE    Imaging Review No results found.   Visual Acuity Review  Right Eye Distance:   Left Eye Distance:   Bilateral Distance:    Right Eye Near:   Left Eye Near:    Bilateral Near:         MDM   1. Bronchitis   Use nasal saline rinse, flonase and allegra OTC as directed. Side effects of Cough medicine discussed with  patient    Teola Bradley, NP 06/21/16 1636

## 2016-07-25 ENCOUNTER — Encounter: Payer: Self-pay | Admitting: Emergency Medicine

## 2016-07-25 ENCOUNTER — Ambulatory Visit
Admission: EM | Admit: 2016-07-25 | Discharge: 2016-07-25 | Disposition: A | Discharge status: Home / Self Care | Payer: BLUE CROSS/BLUE SHIELD | Attending: Emergency Medicine | Admitting: Emergency Medicine

## 2016-07-25 DIAGNOSIS — R6889 Other general symptoms and signs: Secondary | ICD-10-CM | POA: Diagnosis not present

## 2016-07-25 LAB — RAPID STREP SCREEN (MED CTR MEBANE ONLY): STREPTOCOCCUS, GROUP A SCREEN (DIRECT): NEGATIVE

## 2016-07-25 MED ORDER — OSELTAMIVIR PHOSPHATE 75 MG PO CAPS: 75.0000 mg | ORAL_CAPSULE | Freq: Two times a day (BID) | ORAL | 0 refills | Status: DC

## 2016-07-25 MED ORDER — BENZONATATE 200 MG PO CAPS: ORAL_CAPSULE | ORAL | 0 refills | Status: DC

## 2016-07-25 MED ORDER — FLUTICASONE PROPIONATE 50 MCG/ACT NA SUSP: 2.0000 | Freq: Every day | NASAL | 0 refills | Status: DC

## 2016-07-25 NOTE — ED Provider Notes (Signed)
CSN: [id number]     Arrival date & time 07/25/16  [id number] History   First MD Initiated Contact with Patient 07/25/16 [phone]     Chief Complaint  Patient presents with  . Nasal Congestion  . Sore Throat  . Cough   (Consider location/radiation/quality/duration/timing/severity/associated sxs/prior Treatment) HPI  31 year old female who presents with a sudden onset of sore throat runny nose nausea and cough started yesterday. She states that she has had fevers in the low 100s. He did not have a flu shot this year. Works as a Freight forwarder at [employer].        Past Medical History:  Diagnosis Date  . Migraine    Past Surgical History:  Procedure Laterality Date  . DILATION AND CURETTAGE OF UTERUS    . HERNIA REPAIR    . TUBAL LIGATION     History reviewed. No pertinent family history. Social History  Substance Use Topics  . Smoking status: Current Every Day Smoker    Packs/day: 1.00    Types: Cigarettes  . Smokeless tobacco: Never Used  . Alcohol use No   OB History    No data available     Review of Systems  Constitutional: Positive for activity change, chills, fatigue and fever.  HENT: Positive for congestion, postnasal drip, sinus pain, sinus pressure, sore throat and voice change.   Respiratory: Positive for cough and shortness of breath. Negative for wheezing and stridor.   All other systems reviewed and are negative.   Allergies  Benadryl [diphenhydramine] and Ibuprofen  Home Medications   Prior to Admission medications   Medication Sig Start Date End Date Taking? Authorizing Provider  albuterol (PROVENTIL HFA;VENTOLIN HFA) 108 (90 Base) MCG/ACT inhaler Inhale 1-2 puffs into the lungs every 6 (six) hours as needed for wheezing or shortness of breath. 06/21/16   Bhupinder Multani, NP  azithromycin (ZITHROMAX) 250 MG tablet Take 1 tablet (250 mg total) by mouth daily. Take first 2 tablets together, then 1 every day until finished. 06/21/16   Bhupinder Multani, NP   benzonatate (TESSALON) 200 MG capsule Take one cap TID PRN cough 07/25/16   Lorin Picket, PA-C  fluticasone [facility]) 50 MCG/ACT nasal spray Place 2 sprays into both nostrils daily. 07/25/16   Lorin Picket, PA-C  oseltamivir (TAMIFLU) 75 MG capsule Take 1 capsule (75 mg total) by mouth every 12 (twelve) hours. 07/25/16   Lorin Picket, PA-C  rizatriptan (MAXALT) 5 MG tablet Take 5 mg by mouth as needed for migraine. May repeat in 2 hours if needed    Historical Provider, MD   Meds Ordered and Administered this Visit  Medications - No data to display  BP 123/62 (BP Location: Left Arm)   Pulse 67   Temp 98.2 F (36.8 C) (Oral)   Resp 16   Ht 5\' 4"  (1.626 m)   Wt 170 lb (77.1 kg)   LMP 07/04/2016 (Approximate)   SpO2 100%   BMI 29.18 kg/m  No data found.   Physical Exam  Constitutional: She is oriented to person, place, and time. She appears well-developed and well-nourished. No distress.  HENT:  Head: Normocephalic and atraumatic.  Right Ear: External ear normal.  Left Ear: External ear normal.  Nose: Nose normal.  Mouth/Throat: Oropharynx is clear and moist. No oropharyngeal exudate.  Eyes: EOM are normal. Pupils are equal, round, and reactive to light. Right eye exhibits no discharge. Left eye exhibits no discharge.  Neck: Normal range of motion. Neck supple.  Pulmonary/Chest: Effort  normal and breath sounds normal. No respiratory distress. She has no wheezes. She has no rales.  Musculoskeletal: Normal range of motion.  Lymphadenopathy:    She has no cervical adenopathy.  Neurological: She is alert and oriented to person, place, and time.  Skin: Skin is warm and dry. She is not diaphoretic.  Psychiatric: She has a normal mood and affect. Her behavior is normal. Judgment and thought content normal.  Nursing note and vitals reviewed.   Urgent Care Course     Procedures (including critical care time)  Labs Review Labs Reviewed  RAPID STREP SCREEN (NOT AT [facility])   CULTURE, GROUP A STREP [facility])    Imaging Review No results found.   Visual Acuity Review  Right Eye Distance:   Left Eye Distance:   Bilateral Distance:    Right Eye Near:   Left Eye Near:    Bilateral Near:         MDM   1. Flu-like symptoms    Discharge Medication List as of 07/25/2016  9:04 AM    START taking these medications   Details  benzonatate (TESSALON) 200 MG capsule Take one cap TID PRN cough, Normal    fluticasone (FLONASE) 50 MCG/ACT nasal spray Place 2 sprays into both nostrils daily., Starting Fri 07/25/2016, Normal    oseltamivir (TAMIFLU) 75 MG capsule Take 1 capsule (75 mg total) by mouth every 12 (twelve) hours., Starting Fri 07/25/2016, Normal      Plan: 1. Test/x-ray results and diagnosis reviewed with patient 2. rx as per orders; risks, benefits, potential side effects reviewed with patient 3. Recommend supportive treatment with Fluids and rest. Aleve and Tylenol for body aches and fevers. Will provide her with Flonase Tessalon Perle and Tamiflu. She has from her previous visit here for bronchitis in January. He is not improving she'll follow-up with her primary care physician. 4. F/u prn if symptoms worsen or don't improve     Lorin Picket, PA-C 07/25/16 [phone]

## 2016-07-25 NOTE — ED Triage Notes (Signed)
Patient c/o sore throat, runny nose, nausea, and cough that started yesterday.  Patient reports low grade fevers.

## 2016-07-27 LAB — CULTURE, GROUP A STREP (THRC)

## 2017-06-09 ENCOUNTER — Emergency Department: Payer: BLUE CROSS/BLUE SHIELD

## 2017-06-09 ENCOUNTER — Emergency Department
Admission: EM | Admit: 2017-06-09 | Discharge: 2017-06-09 | Disposition: A | Discharge status: Home / Self Care | Payer: BLUE CROSS/BLUE SHIELD | Attending: Student in an Organized Health Care Education/Training Program | Admitting: Student in an Organized Health Care Education/Training Program

## 2017-06-09 ENCOUNTER — Other Ambulatory Visit: Payer: Self-pay

## 2017-06-09 DIAGNOSIS — F17210 Nicotine dependence, cigarettes, uncomplicated: Secondary | ICD-10-CM | POA: Diagnosis not present

## 2017-06-09 DIAGNOSIS — K529 Noninfective gastroenteritis and colitis, unspecified: Secondary | ICD-10-CM

## 2017-06-09 DIAGNOSIS — R197 Diarrhea, unspecified: Secondary | ICD-10-CM

## 2017-06-09 DIAGNOSIS — R1110 Vomiting, unspecified: Secondary | ICD-10-CM | POA: Diagnosis not present

## 2017-06-09 DIAGNOSIS — Z79899 Other long term (current) drug therapy: Secondary | ICD-10-CM | POA: Diagnosis not present

## 2017-06-09 HISTORY — DX: Noninfective gastroenteritis and colitis, unspecified: K52.9

## 2017-06-09 HISTORY — DX: Malignant (primary) neoplasm, unspecified: C80.1

## 2017-06-09 LAB — URINALYSIS, COMPLETE (UACMP) WITH MICROSCOPIC
Bacteria, UA: NONE SEEN
Bilirubin Urine: NEGATIVE
Glucose, UA: NEGATIVE mg/dL
Hgb urine dipstick: NEGATIVE
Ketones, ur: NEGATIVE mg/dL
Nitrite: NEGATIVE
PH: 6 (ref 5.0–8.0)
Protein, ur: NEGATIVE mg/dL
SPECIFIC GRAVITY, URINE: 1.018 (ref 1.005–1.030)

## 2017-06-09 LAB — COMPREHENSIVE METABOLIC PANEL
ALBUMIN: 4.1 g/dL (ref 3.5–5.0)
ALK PHOS: 45 U/L (ref 38–126)
ALT: 15 U/L (ref 14–54)
ANION GAP: 5 (ref 5–15)
AST: 21 U/L (ref 15–41)
BUN: 11 mg/dL (ref 6–20)
CALCIUM: 8.9 mg/dL (ref 8.9–10.3)
CO2: 26 mmol/L (ref 22–32)
CREATININE: 0.76 mg/dL (ref 0.44–1.00)
Chloride: 107 mmol/L (ref 101–111)
GFR calc Af Amer: >60 mL/min (ref >60)
GFR calc non Af Amer: >60 mL/min (ref >60)
GLUCOSE: 101 mg/dL — AB (ref 65–99)
Potassium: 3.9 mmol/L (ref 3.5–5.1)
Sodium: 138 mmol/L (ref 135–145)
Total Bilirubin: 0.9 mg/dL (ref 0.3–1.2)
Total Protein: 7.1 g/dL (ref 6.5–8.1)

## 2017-06-09 LAB — CBC
HCT: 37.2 % (ref 35.0–47.0)
Hemoglobin: 12.3 g/dL (ref 12.0–16.0)
MCH: 26.5 pg (ref 26.0–34.0)
MCHC: 33.1 g/dL (ref 32.0–36.0)
MCV: 80.2 fL (ref 80.0–100.0)
PLATELETS: 246 10*3/uL (ref 150–440)
RBC: 4.64 MIL/uL (ref 3.80–5.20)
RDW: 15.4 % — AB (ref 11.5–14.5)
WBC: 5.8 10*3/uL (ref 3.6–11.0)

## 2017-06-09 LAB — PREGNANCY, URINE: Preg Test, Ur: NEGATIVE

## 2017-06-09 LAB — LIPASE, BLOOD: Lipase: 27 U/L (ref 11–51)

## 2017-06-09 MED ORDER — MORPHINE SULFATE (PF) 4 MG/ML IV SOLN
4.0000 mg | INTRAVENOUS | Status: DC | PRN
  Administered 2017-06-09: 4 mg via INTRAVENOUS
  Filled 2017-06-09: qty 1

## 2017-06-09 MED ORDER — SODIUM CHLORIDE 0.9 % IV BOLUS (SEPSIS)
1000.0000 mL | Freq: Once | INTRAVENOUS | Status: AC
  Administered 2017-06-09: 1000 mL via INTRAVENOUS

## 2017-06-09 MED ORDER — IOPAMIDOL (ISOVUE-300) INJECTION 61%
15.0000 mL | INTRAVENOUS | Status: AC
  Administered 2017-06-09: 30 mL via ORAL
  Filled 2017-06-09 (×2): qty 15

## 2017-06-09 MED ORDER — METRONIDAZOLE 250 MG PO TABS: 250.0000 mg | ORAL_TABLET | Freq: Three times a day (TID) | ORAL | 0 refills | Status: AC

## 2017-06-09 MED ORDER — CIPROFLOXACIN HCL 500 MG PO TABS
500.0000 mg | ORAL_TABLET | Freq: Once | ORAL | Status: AC
  Administered 2017-06-09: 500 mg via ORAL
  Filled 2017-06-09: qty 1

## 2017-06-09 MED ORDER — CIPROFLOXACIN HCL 500 MG PO TABS: 500.0000 mg | ORAL_TABLET | Freq: Two times a day (BID) | ORAL | 0 refills | Status: AC

## 2017-06-09 MED ORDER — IOPAMIDOL (ISOVUE-300) INJECTION 61%
100.0000 mL | Freq: Once | INTRAVENOUS | Status: AC | PRN
  Administered 2017-06-09: 100 mL via INTRAVENOUS
  Filled 2017-06-09: qty 100

## 2017-06-09 MED ORDER — METRONIDAZOLE 500 MG PO TABS
500.0000 mg | ORAL_TABLET | Freq: Once | ORAL | Status: AC
  Administered 2017-06-09: 500 mg via ORAL
  Filled 2017-06-09: qty 1

## 2017-06-09 MED ORDER — PROMETHAZINE HCL 25 MG/ML IJ SOLN
12.5000 mg | Freq: Four times a day (QID) | INTRAMUSCULAR | Status: DC | PRN
  Administered 2017-06-09: 12.5 mg via INTRAVENOUS
  Filled 2017-06-09: qty 1

## 2017-06-09 MED ORDER — PROMETHAZINE HCL 12.5 MG PO TABS: 12.5000 mg | ORAL_TABLET | Freq: Four times a day (QID) | ORAL | 0 refills | Status: DC | PRN

## 2017-06-09 NOTE — Discharge Instructions (Signed)

## 2017-06-09 NOTE — ED Provider Notes (Signed)
[facility] Emergency Department Provider Note    First MD Initiated Contact with Patient 06/09/17 1112     (approximate)  I have reviewed the triage vital signs and the nursing notes.   HISTORY  Chief Complaint Emesis and Diarrhea    HPI Desiree Green is a 32 y.o. female with reported history of colitis but uncertain type or diagnosis presents with crampy abdominal pain with blood in her stools feeling fatigued nauseated with mild to moderate cramping abdominal pain in the periumbilical region for the past 4-5 days.  Does have family history of IBD.  Denies any chest pain or shortness of breath.  Has noted decreased urinary frequency.  Has not tried anything to help symptoms at home.  Past Medical History:  Diagnosis Date  . Colitis   . Migraine    No family history on file. Past Surgical History:  Procedure Laterality Date  . DILATION AND CURETTAGE OF UTERUS    . HERNIA REPAIR    . TUBAL LIGATION     There are no active problems to display for this patient.     Prior to Admission medications   Medication Sig Start Date End Date Taking? Authorizing Provider  albuterol (PROVENTIL HFA;VENTOLIN HFA) 108 (90 Base) MCG/ACT inhaler Inhale 1-2 puffs into the lungs every 6 (six) hours as needed for wheezing or shortness of breath. 06/21/16   Multani, Bhupinder, NP  azithromycin (ZITHROMAX) 250 MG tablet Take 1 tablet (250 mg total) by mouth daily. Take first 2 tablets together, then 1 every day until finished. 06/21/16   Multani, Bhupinder, NP  benzonatate (TESSALON) 200 MG capsule Take one cap TID PRN cough 07/25/16   Crecencio Mc P, PA-C  fluticasone [facility]) 50 MCG/ACT nasal spray Place 2 sprays into both nostrils daily. 07/25/16   Lorin Picket, PA-C  oseltamivir (TAMIFLU) 75 MG capsule Take 1 capsule (75 mg total) by mouth every 12 (twelve) hours. 07/25/16   Lorin Picket, PA-C  rizatriptan (MAXALT) 5 MG tablet Take 5 mg by mouth as  needed for migraine. May repeat in 2 hours if needed    [provider]    Allergies Benadryl [diphenhydramine] and Ibuprofen    Social History Social History   Tobacco Use  . Smoking status: Current Every Day Smoker    Packs/day: 1.00    Types: Cigarettes  . Smokeless tobacco: Never Used  Substance Use Topics  . Alcohol use: No  . Drug use: No    Review of Systems Patient denies headaches, rhinorrhea, blurry vision, numbness, shortness of breath, chest pain, edema, cough, abdominal pain, nausea, vomiting, diarrhea, dysuria, fevers, rashes or hallucinations unless otherwise stated above in HPI. ____________________________________________   PHYSICAL EXAM:  VITAL SIGNS: Vitals:   06/09/17 1043  BP: 121/74  Pulse: 84  Resp: 18  Temp: 98.8 F (37.1 C)  SpO2: 98%    Constitutional: Alert and oriented. Well appearing and in no acute distress. Eyes: Conjunctivae are normal.  Head: Atraumatic. Nose: No congestion/rhinnorhea. Mouth/Throat: Mucous membranes are moist.   Neck: No stridor. Painless ROM.  Cardiovascular: Normal rate, regular rhythm. Grossly normal heart sounds.  Good peripheral circulation. Respiratory: Normal respiratory effort.  No retractions. Lungs CTAB. Gastrointestinal: Soft and nontender. No distention. No abdominal bruits. No CVA tenderness. Genitourinary:  Musculoskeletal: No lower extremity tenderness nor edema.  No joint effusions. Neurologic:  Normal speech and language. No gross focal neurologic deficits are appreciated. No facial droop Skin:  Skin is warm, dry  and intact. No rash noted. Psychiatric: Mood and affect are normal. Speech and behavior are normal.  ____________________________________________   LABS (all labs ordered are listed, but only abnormal results are displayed)  Results for orders placed or performed during the hospital encounter of 06/09/17 (from the past 24 hour(s))  Lipase, blood     Status: None    Collection Time: 06/09/17 10:44 AM  Result Value Ref Range   Lipase 27 11 - 51 U/L  Comprehensive metabolic panel     Status: Abnormal   Collection Time: 06/09/17 10:44 AM  Result Value Ref Range   Sodium 138 135 - 145 mmol/L   Potassium 3.9 3.5 - 5.1 mmol/L   Chloride 107 101 - 111 mmol/L   CO2 26 22 - 32 mmol/L   Glucose, Bld 101 (H) 65 - 99 mg/dL   BUN 11 6 - 20 mg/dL   Creatinine, Ser 0.76 0.44 - 1.00 mg/dL   Calcium 8.9 8.9 - 10.3 mg/dL   Total Protein 7.1 6.5 - 8.1 g/dL   Albumin 4.1 3.5 - 5.0 g/dL   AST 21 15 - 41 U/L   ALT 15 14 - 54 U/L   Alkaline Phosphatase 45 38 - 126 U/L   Total Bilirubin 0.9 0.3 - 1.2 mg/dL   GFR calc non Af Amer >60 >60 mL/min   GFR calc Af Amer >60 >60 mL/min   Anion gap 5 5 - 15  CBC     Status: Abnormal   Collection Time: 06/09/17 10:44 AM  Result Value Ref Range   WBC 5.8 3.6 - 11.0 K/uL   RBC 4.64 3.80 - 5.20 MIL/uL   Hemoglobin 12.3 12.0 - 16.0 g/dL   HCT 37.2 35.0 - 47.0 %   MCV 80.2 80.0 - 100.0 fL   MCH 26.5 26.0 - 34.0 pg   MCHC 33.1 32.0 - 36.0 g/dL   RDW 15.4 (H) 11.5 - 14.5 %   Platelets 246 150 - 440 K/uL   ____________________________________________  EKG__________________________________________  ED ECG REPORT I, Merlyn Lot, the attending physician, personally viewed and interpreted this ECG.   Date: 06/09/2017  EKG Time: 10:45  Rate: 74  Rhythm: normal EKG, normal sinus rhythm, unchanged from previous tracings  Axis: normal  Intervals: normal  ST&T Change: no st elevations or depressions noted   __  RADIOLOGY  I personally reviewed all radiographic images ordered to evaluate for the above acute complaints and reviewed radiology reports and findings.  These findings were personally discussed with the patient.  Please see medical record for radiology report.  ____________________________________________   PROCEDURES  Procedure(s) performed:  Procedures    Critical Care performed:  no ____________________________________________   INITIAL IMPRESSION / ASSESSMENT AND PLAN / ED COURSE  Pertinent labs & imaging results that were available during my care of the patient were reviewed by me and considered in my medical decision making (see chart for details).  DDX: IBD, UC, IBS, stricture, appendicitis, infectious colitis  Desiree Green is a 32 y.o. who presents to the ED with diarrheal illness as described above.  Patient was diagnosed with colitis recently by urgent care was not started on any antibiotics.  Patient was trying to follow-up with GI but has not done so were gotten a referral from PCP.  Her abdominal exam does have some tenderness but no peritonitis.  Based on the duration of symptoms and concern for possible inflammatory bowel disease CT imaging for the above differential shows evidence of colitis.  Blood work is otherwise reassuring with no acidosis.  Will give referral to GI.  Patient tolerating oral hydration.  Will treat empirically with Cipro and Flagyl however I am also concerned for inflammatory bowel disease.  Discussed strict return precautions.  Have discussed with the patient and available family all diagnostics and treatments performed thus far and all questions were answered to the best of my ability. The patient demonstrates understanding and agreement with plan.       ____________________________________________   FINAL CLINICAL IMPRESSION(S) / ED DIAGNOSES  Final diagnoses:  Colitis  Diarrhea of presumed infectious origin      NEW MEDICATIONS STARTED DURING THIS VISIT:  This SmartLink is deprecated. Use AVSMEDLIST instead to display the medication list for a patient.   Note:  This document was prepared using Dragon voice recognition software and may include unintentional dictation errors.    Merlyn Lot, MD 06/09/17 1339

## 2017-06-09 NOTE — ED Triage Notes (Signed)
Reports NVD since Sunday, bright red blood in stool. Hx of colitis. Fatigued. Pt alert and oriented X4, active, cooperative, pt in NAD. RR even and unlabored, color WNL.

## 2017-06-10 LAB — POCT PREGNANCY, URINE: PREG TEST UR: NEGATIVE

## 2017-06-22 ENCOUNTER — Other Ambulatory Visit: Payer: Self-pay

## 2017-06-22 DIAGNOSIS — R519 Headache, unspecified: Secondary | ICD-10-CM | POA: Insufficient documentation

## 2017-06-22 DIAGNOSIS — R51 Headache: Secondary | ICD-10-CM

## 2017-06-22 DIAGNOSIS — G43909 Migraine, unspecified, not intractable, without status migrainosus: Secondary | ICD-10-CM | POA: Insufficient documentation

## 2017-06-22 DIAGNOSIS — K429 Umbilical hernia without obstruction or gangrene: Secondary | ICD-10-CM | POA: Insufficient documentation

## 2017-06-23 ENCOUNTER — Encounter: Payer: Self-pay | Admitting: Gastroenterology

## 2017-06-23 ENCOUNTER — Ambulatory Visit (INDEPENDENT_AMBULATORY_CARE_PROVIDER_SITE_OTHER): Payer: BLUE CROSS/BLUE SHIELD | Admitting: Gastroenterology

## 2017-06-23 VITALS — BP 117/77 | HR 79 | Temp 98.6°F | Ht 64.0 in | Wt 173.0 lb

## 2017-06-23 DIAGNOSIS — K625 Hemorrhage of anus and rectum: Secondary | ICD-10-CM

## 2017-06-23 DIAGNOSIS — R109 Unspecified abdominal pain: Secondary | ICD-10-CM | POA: Diagnosis not present

## 2017-06-23 DIAGNOSIS — R194 Change in bowel habit: Secondary | ICD-10-CM

## 2017-06-23 MED ORDER — DICYCLOMINE HCL 10 MG PO CAPS: 10.0000 mg | ORAL_CAPSULE | Freq: Four times a day (QID) | ORAL | 0 refills | Status: DC | PRN

## 2017-06-23 NOTE — Progress Notes (Signed)
Desiree Darby, MD [address]  [place]  [place], [place] [address]  Main: [phone]  Fax: [phone]    Gastroenterology Consultation  Referring Provider:     Care, [facility] Primary Care Physician:  Care, [facility] Primary Gastroenterologist:  Dr. Cephas Green Reason for Consultation:     Abdominal cramps, altered bowel habits, rectal bleeding        HPI:   Desiree Green is a 32 y.o. female referred by Dr. Dossie Arbour, [facility]  for consultation & management of chronic history of lower GI symptoms including generalized abdominal cramps, frequent bowel movements associated with rectal bleeding. This started approximately 2-3 years ago after her umbilical hernia repair and have been gradually getting worse. She does report urgency and sometimes nocturnal symptoms. She does report intermittent episodes of constipation about once a week that last for 2-3 days. She reports bloating. She denies weight loss, in fact gained a few pounds. She does consume diet sodas about 2 per day and chews gum all day at work. 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 She had cross sectional imaging about 3 weeks ago when she presented to the ER with flareup of above symptoms. This showed mild thickening in left colon and therefore referred here further evaluation. Patient reports that her regular doctor never referred her to gastroenterologist and was suggested to try some herbal supplements in the past. Apparently, patient has been dealing with recurrent sinus infections and on several different antibiotics within last 1 year. She has history of cervical and uterine cancer, underwent D&C/LEEP procedure. She reports that her cancer is in remission as of 2 years ago.   NSAIDs: None  Antiplts/Anticoagulants/Anti thrombotics: None  GI Procedures: None Denies family history of IBD or GI malignancy  Denies any GI surgeries   Past Medical History:    Diagnosis Date  . Cancer ([place])   . Colitis   . Migraine     Past Surgical History:  Procedure Laterality Date  . DILATION AND CURETTAGE OF UTERUS    . HERNIA REPAIR    . TUBAL LIGATION       Current Outpatient Medications:  .  fluticasone (FLONASE) 50 MCG/ACT nasal spray, Place 2 sprays into both nostrils daily., Disp: 16 g, Rfl: 0 .  Probiotic Product ([place]), Take by mouth., Disp: , Rfl:  .  promethazine (PHENERGAN) 12.5 MG tablet, Take 1 tablet (12.5 mg total) by mouth every 6 (six) hours as needed for nausea or vomiting., Disp: 12 tablet, Rfl: 0 .  rizatriptan (MAXALT) 5 MG tablet, Take 5 mg by mouth as needed for migraine. May repeat in 2 hours if needed, Disp: , Rfl:  .  dicyclomine (BENTYL) 10 MG capsule, Take 1 capsule (10 mg total) by mouth every 6 (six) hours as needed for spasms., Disp: 30 capsule, Rfl: 0   No family history on file.   Social History   Tobacco Use  . Smoking status: Current Every Day Smoker    Packs/day: 1.00    Types: Cigarettes  . Smokeless tobacco: Never Used  Substance Use Topics  . Alcohol use: No  . Drug use: No    Allergies as of 06/23/2017 - Review Complete 06/23/2017  Allergen Reaction Noted  . Benadryl [diphenhydramine] Hives 07/25/2016  . Ibuprofen Hives 02/19/2016    Review of Systems:    All systems reviewed and negative except where noted in HPI.  Physical Exam:  BP 117/77   Pulse 79   Temp 98.6 F (37 C)   Ht 5\' 4"  (1.626 m)   Wt 173 lb (78.5 kg)   LMP 06/02/2017 Comment: pt signed pregnancy waiver form  BMI 29.70 kg/m  Patient's last menstrual period was 06/02/2017.  General:   Alert,  Well-developed, well-nourished, pleasant and cooperative in NAD Head:  Normocephalic and atraumatic. Eyes:  Sclera clear, no icterus.   Conjunctiva pink. Ears:  Normal auditory acuity. Nose:  No deformity, discharge, or lesions. Mouth:  No deformity or lesions,oropharynx pink & moist. Neck:  Supple; no  masses or thyromegaly. Lungs:  Respirations even and unlabored.  Clear throughout to auscultation.   No wheezes, crackles, or rhonchi. No acute distress. Heart:  Regular rate and rhythm; no murmurs, clicks, rubs, or gallops. Abdomen:  Normal bowel sounds. Soft, non-tender and non-distended without masses, hepatosplenomegaly or hernias noted.  No guarding or rebound tenderness.   Rectal: Not performed Msk:  Symmetrical without gross deformities. Good, equal movement & strength bilaterally. Pulses:  Normal pulses noted. Extremities:  No clubbing or edema.  No cyanosis. Neurologic:  Alert and oriented x3;  grossly normal neurologically. Skin:  Intact without significant lesions or rashes. No jaundice. Lymph Nodes:  No significant cervical adenopathy. Psych:  Alert and cooperative. Normal mood and affect.  Imaging Studies: Reviewed   Assessment and Plan:   Desiree Green is a 32 y.o. female with history of cervical and uterine cancer s/p LEEP, chronic history of abdominal cramps, altered bowel habits and intermittent rectal bleeding, CT on 2 separate occasions revealed left colon thickening. She consumes diet sodas and chews gum daily. Her symptoms are most likely related to IBS, aggravated by using artificial sweeteners  - Recommend to stop consuming carbonated beverages and chewing gum - Avoid processed meats - Dicyclomine as needed for abdominal cramps - Recommend colonoscopy given abnormal CT  Follow up in 4 weeks  Desiree Darby, MD

## 2017-06-23 NOTE — Patient Instructions (Signed)
Today's care has been provided by Dr. Sherri Sear.  Dr. Marius Ditch has advised the following:  1. Colonoscopy scheduled at [facility] Wed 30th (see instructions) 2. Rx for Bentyl to help with stomach cramps sent to pharmacy.  Take 1 every 6 hours as needed. 3. Follow up with Dr. Marius Ditch in 4 weeks.  Thank you for allowing [place] to help you with your healthcare needs.  Staff at [employer].

## 2017-06-24 ENCOUNTER — Other Ambulatory Visit: Payer: Self-pay

## 2017-06-24 DIAGNOSIS — R109 Unspecified abdominal pain: Secondary | ICD-10-CM

## 2017-06-24 DIAGNOSIS — K625 Hemorrhage of anus and rectum: Secondary | ICD-10-CM

## 2017-06-25 ENCOUNTER — Encounter: Payer: Self-pay | Admitting: *Deleted

## 2017-06-25 ENCOUNTER — Other Ambulatory Visit: Payer: Self-pay

## 2017-06-25 ENCOUNTER — Encounter: Payer: Self-pay | Admitting: Anesthesiology

## 2017-06-30 NOTE — Discharge Instructions (Signed)
General Anesthesia, Adult, Care After °These instructions provide you with information about caring for yourself after your procedure. Your health care provider may also give you more specific instructions. Your treatment has been planned according to current medical practices, but problems sometimes occur. Call your health care provider if you have any problems or questions after your procedure. °What can I expect after the procedure? °After the procedure, it is common to have: °· Vomiting. °· A sore throat. °· Mental slowness. ° °It is common to feel: °· Nauseous. °· Cold or shivery. °· Sleepy. °· Tired. °· Sore or achy, even in parts of your body where you did not have surgery. ° °Follow these instructions at home: °For at least 24 hours after the procedure: °· Do not: °? Participate in activities where you could fall or become injured. °? Drive. °? Use heavy machinery. °? Drink alcohol. °? Take sleeping pills or medicines that cause drowsiness. °? Make important decisions or sign legal documents. °? Take care of children on your own. °· Rest. °Eating and drinking °· If you vomit, drink water, juice, or soup when you can drink without vomiting. °· Drink enough fluid to keep your urine clear or pale yellow. °· Make sure you have little or no nausea before eating solid foods. °· Follow the diet recommended by your health care provider. °General instructions °· Have a responsible adult stay with you until you are awake and alert. °· Return to your normal activities as told by your health care provider. Ask your health care provider what activities are safe for you. °· Take over-the-counter and prescription medicines only as told by your health care provider. °· If you smoke, do not smoke without supervision. °· Keep all follow-up visits as told by your health care provider. This is important. °Contact a health care provider if: °· You continue to have nausea or vomiting at home, and medicines are not helpful. °· You  cannot drink fluids or start eating again. °· You cannot urinate after 8-12 hours. °· You develop a skin rash. °· You have fever. °· You have increasing redness at the site of your procedure. °Get help right away if: °· You have difficulty breathing. °· You have chest pain. °· You have unexpected bleeding. °· You feel that you are having a life-threatening or urgent problem. °This information is not intended to replace advice given to you by your health care provider. Make sure you discuss any questions you have with your health care provider. °Document Released: 08/25/2000 Document Revised: 10/22/2015 Document Reviewed: 05/03/2015 °Elsevier Interactive Patient Education © 2018 [employer] ° °

## 2017-07-15 ENCOUNTER — Ambulatory Visit
Admission: RE | Admit: 2017-07-15 | Payer: BLUE CROSS/BLUE SHIELD | Source: Ambulatory Visit | Admitting: Gastroenterology

## 2017-07-15 SURGERY — COLONOSCOPY WITH PROPOFOL
Anesthesia: Choice

## 2017-07-21 ENCOUNTER — Ambulatory Visit: Payer: BLUE CROSS/BLUE SHIELD | Admitting: Gastroenterology

## 2018-02-09 ENCOUNTER — Encounter: Payer: Self-pay | Admitting: Obstetrics & Gynecology

## 2018-02-09 ENCOUNTER — Ambulatory Visit (INDEPENDENT_AMBULATORY_CARE_PROVIDER_SITE_OTHER): Payer: Managed Care, Other (non HMO) | Admitting: Obstetrics & Gynecology

## 2018-02-09 VITALS — BP 120/80 | Ht 64.0 in | Wt 176.0 lb

## 2018-02-09 DIAGNOSIS — Z0000 Encounter for general adult medical examination without abnormal findings: Secondary | ICD-10-CM

## 2018-02-09 DIAGNOSIS — Z01411 Encounter for gynecological examination (general) (routine) with abnormal findings: Secondary | ICD-10-CM | POA: Diagnosis not present

## 2018-02-09 DIAGNOSIS — N921 Excessive and frequent menstruation with irregular cycle: Secondary | ICD-10-CM

## 2018-02-09 DIAGNOSIS — R635 Abnormal weight gain: Secondary | ICD-10-CM | POA: Diagnosis not present

## 2018-02-09 DIAGNOSIS — Z124 Encounter for screening for malignant neoplasm of cervix: Secondary | ICD-10-CM

## 2018-02-09 MED ORDER — PHENTERMINE HCL 37.5 MG PO TABS: 37.5000 mg | ORAL_TABLET | Freq: Every day | ORAL | 0 refills | Status: DC

## 2018-02-09 NOTE — Patient Instructions (Signed)

## 2018-02-09 NOTE — Progress Notes (Signed)
HPI:      [name] is a 32 y.o. [id number] who LMP was Patient's last menstrual period was 02/02/2018., she presents today for her annual examination. The patient c/o WEIGHT GAIN and IRREG PERIODS today.  Weight gain since delivery 4 years ago, lost to 135 but now back up to 175 and difficult to lose despite diet and exercise.  Periods are every 3-4 weeks and heavy first 2 days.   The patient is sexually active. Her last pap: approximate date 2015 and was normal and she has had LEEP in 2007. The patient does perform self breast exams.  There is notable family history of breast or ovarian cancer in her family.  The patient has regular exercise: yes.  The patient denies current symptoms of depression.    GYN History: Contraception: tubal ligation  PMHx: Past Medical History:  Diagnosis Date  . Cancer ([place])   . Colitis   . Migraine    1x/mo   Past Surgical History:  Procedure Laterality Date  . DILATION AND CURETTAGE OF UTERUS    . HERNIA REPAIR    . TUBAL LIGATION     Family History  Problem Relation Age of Onset  . Breast cancer Maternal Aunt   . Hypertension Father   . Ovarian cancer Sister   . Melanoma Maternal Grandmother   . Colon cancer Paternal Grandfather   . Lung cancer Paternal Grandfather    Social History   Tobacco Use  . Smoking status: Current Every Day Smoker    Packs/day: 1.00    Years: 16.00    Pack years: 16.00    Types: Cigarettes  . Smokeless tobacco: Never Used  . Tobacco comment: started age 28.  currently says 2-3 cigs/day  Substance Use Topics  . Alcohol use: No  . Drug use: No    Current Outpatient Medications:  .  dicyclomine (BENTYL) 10 MG capsule, Take 1 capsule (10 mg total) by mouth every 6 (six) hours as needed for spasms. (Patient not taking: Reported on 02/09/2018), Disp: 30 capsule, Rfl: 0 .  fluticasone (FLONASE) 50 MCG/ACT nasal spray, Place 2 sprays into both nostrils daily. (Patient not taking: Reported on 02/09/2018),  Disp: 16 g, Rfl: 0 .  phentermine (ADIPEX-P) 37.5 MG tablet, Take 1 tablet (37.5 mg total) by mouth daily before breakfast., Disp: 30 tablet, Rfl: 0 .  Probiotic Product ([place]), Take by mouth., Disp: , Rfl:  .  promethazine (PHENERGAN) 12.5 MG tablet, Take 1 tablet (12.5 mg total) by mouth every 6 (six) hours as needed for nausea or vomiting. (Patient not taking: Reported on 02/09/2018), Disp: 12 tablet, Rfl: 0 .  rizatriptan (MAXALT) 5 MG tablet, Take 5 mg by mouth as needed for migraine. May repeat in 2 hours if needed, Disp: , Rfl:  Allergies: Benadryl [diphenhydramine]; Ibuprofen; and Lactose intolerance (gi)  Review of Systems  Constitutional: Positive for malaise/fatigue. Negative for chills and fever.  HENT: Negative for congestion, sinus pain and sore throat.   Eyes: Negative for blurred vision and pain.  Respiratory: Negative for cough and wheezing.   Cardiovascular: Negative for chest pain and leg swelling.  Gastrointestinal: Negative for abdominal pain, constipation, diarrhea, heartburn, nausea and vomiting.  Genitourinary: Negative for dysuria, frequency, hematuria and urgency.  Musculoskeletal: Negative for back pain, joint pain, myalgias and neck pain.  Skin: Negative for itching and rash.  Neurological: Negative for dizziness, tremors and weakness.  Endo/Heme/Allergies: Does not bruise/bleed easily.  Psychiatric/Behavioral: Negative for  depression. The patient is not nervous/anxious and does not have insomnia.     Objective: BP 120/80   Ht 5\' 4"  (1.626 m)   Wt 176 lb (79.8 kg)   LMP 02/02/2018   BMI 30.21 kg/m   Filed Weights   02/09/18 1105  Weight: 176 lb (79.8 kg)   Body mass index is 30.21 kg/m. Physical Exam  Constitutional: She is oriented to person, place, and time. She appears well-developed and well-nourished. No distress.  Genitourinary: Rectum normal, vagina normal and uterus normal. Pelvic exam was performed with patient supine. There  is no rash or lesion on the right labia. There is no rash or lesion on the left labia. Vagina exhibits no lesion. No bleeding in the vagina. Right adnexum does not display mass and does not display tenderness. Left adnexum does not display mass and does not display tenderness. Cervix does not exhibit motion tenderness, lesion, friability or polyp.   Uterus is mobile and midaxial. Uterus is not enlarged or exhibiting a mass.  HENT:  Head: Normocephalic and atraumatic. Head is without laceration.  Right Ear: Hearing normal.  Left Ear: Hearing normal.  Nose: No epistaxis.  No foreign bodies.  Mouth/Throat: Uvula is midline, oropharynx is clear and moist and mucous membranes are normal.  Eyes: Pupils are equal, round, and reactive to light.  Neck: Normal range of motion. Neck supple. No thyromegaly present.  Cardiovascular: Normal rate and regular rhythm. Exam reveals no gallop and no friction rub.  No murmur heard. Pulmonary/Chest: Effort normal and breath sounds normal. No respiratory distress. She has no wheezes. Right breast exhibits no mass, no skin change and no tenderness. Left breast exhibits no mass, no skin change and no tenderness.  Abdominal: Soft. Bowel sounds are normal. She exhibits no distension. There is no tenderness. There is no rebound.  Musculoskeletal: Normal range of motion.  Neurological: She is alert and oriented to person, place, and time. No cranial nerve deficit.  Skin: Skin is warm and dry.  Psychiatric: She has a normal mood and affect. Judgment normal.  Vitals reviewed.  Assessment:  ANNUAL EXAM 1. Annual physical exam   2. Screening for cervical cancer   3. Menometrorrhagia    Screening Plan:            1.  Cervical Screening-  Pap smear done today  2. Breast screening- Exam annually and mammogram>40 planned   3. Colonoscopy every 10 years, Hemoccult testing - after age 30  4. Labs were done at work 2 weeks ago ([facility]).  High Trigly but o/w normal  chol level.  Plan to recheck here next year,  5. Counseling for contraception: bilateral tubal ligation   6. Weight gain, BMI 30 Options discussed.  Phentermine pros and cons.  Rx.  F/u.  7. Menometrorrhagia - Patient has abnormal uterine bleeding . She has a normal exam today, with no evidence of lesions.  Evaluation includes the following: exam, labs such as hormonal testing, and pelvic ultrasound to evaluate for any structural gynecologic abnormalities.  Patient to follow up after testing. - Treatment option for menorrhagia or menometrorrhagia discussed in great detail with the patient.  Options include hormonal therapy, IUD therapy such as Mirena, D&C, Ablation, and Hysterectomy.  The pros and cons of each option discussed with patient. - US PELVIS TRANSVANGINAL NON-OB (TV ONLY) ordered    F/U  Return in about 1 week (around 02/16/2018) for Follow up w [place] [facility].  Barnett Applebaum, MD, [facility], [facility] 02/09/2018  11:26 AM

## 2018-02-12 LAB — IGP, APTIMA HPV
HPV APTIMA: NEGATIVE
PAP SMEAR COMMENT: 0

## 2018-02-24 ENCOUNTER — Encounter: Payer: Self-pay | Admitting: Obstetrics & Gynecology

## 2018-02-24 ENCOUNTER — Ambulatory Visit: Payer: Managed Care, Other (non HMO) | Admitting: Obstetrics & Gynecology

## 2018-02-24 ENCOUNTER — Ambulatory Visit (INDEPENDENT_AMBULATORY_CARE_PROVIDER_SITE_OTHER): Payer: Managed Care, Other (non HMO)

## 2018-02-24 VITALS — BP 110/60 | Ht 64.0 in | Wt 173.0 lb

## 2018-02-24 DIAGNOSIS — N83291 Other ovarian cyst, right side: Secondary | ICD-10-CM

## 2018-02-24 DIAGNOSIS — Z6829 Body mass index (BMI) 29.0-29.9, adult: Secondary | ICD-10-CM

## 2018-02-24 DIAGNOSIS — E663 Overweight: Secondary | ICD-10-CM

## 2018-02-24 DIAGNOSIS — N8302 Follicular cyst of left ovary: Secondary | ICD-10-CM | POA: Diagnosis not present

## 2018-02-24 DIAGNOSIS — N921 Excessive and frequent menstruation with irregular cycle: Secondary | ICD-10-CM | POA: Diagnosis not present

## 2018-02-24 MED ORDER — PHENTERMINE HCL 37.5 MG PO TABS: 37.5000 mg | ORAL_TABLET | Freq: Every day | ORAL | 0 refills | Status: DC

## 2018-02-24 NOTE — Progress Notes (Signed)
  HPI: Pt has been having more frequent cycles with 2-3 days heavy flow and this has been disruptive to her.  BTL.  Also started weight loss medicine Phentermine and had some positive effects and also side effects of dizziness, headache, nausea.  This has resolved when she cuts pill in half and takes half a dose per day.  Ultrasound demonstrates no masses seen, no uterine pathology  PMHx: She  has a past medical history of Cancer ([place]), Colitis, and Migraine. Also,  has a past surgical history that includes Dilation and curettage of uterus; Hernia repair; and Tubal ligation., family history includes Breast cancer in her maternal aunt; Colon cancer in her paternal grandfather; Hypertension in her father; Lung cancer in her paternal grandfather; Melanoma in her maternal grandmother; Ovarian cancer in her sister.,  reports that she has been smoking cigarettes. She has a 16.00 pack-year smoking history. She has never used smokeless tobacco. She reports that she does not drink alcohol or use drugs.  She has a current medication list which includes the following prescription(s): dicyclomine, fluticasone, phentermine, probiotic product, promethazine, and rizatriptan. Also, is allergic to benadryl [diphenhydramine]; ibuprofen; and lactose intolerance (gi).  Review of Systems  All other systems reviewed and are negative.   Objective: BP 110/60   Ht 5\' 4"  (1.626 m)   Wt 173 lb (78.5 kg)   LMP 02/02/2018   BMI 29.70 kg/m   Physical examination Constitutional NAD, Conversant  Skin No rashes, lesions or ulceration.   Extremities: Moves all appropriately.  Normal ROM for age. No lymphadenopathy.  Neuro: Grossly intact  Psych: Oriented to PPT.  Normal mood. Normal affect.   Assessment:  Metrorrhagia  Overweight (BMI 25.0-29.9)  Cont Phentermine and monitor side effects.  Monitor for weight loss.  Patient has abnormal uterine bleeding . Treatment option for menorrhagia or menometrorrhagia  discussed in great detail with the patient.  Options include hormonal therapy, IUD therapy such as Mirena, D&C, Ablation, and Hysterectomy.  The pros and cons of each option discussed with patient.  Patient was told that it is normal to have menstrual bleeding after an endometrial ablation, only about 40% of patients become amenorrheic, 50% of patients have normal or light periods, and 10% of patients have no change in their bleeding pattern and may need further intervention.  She was told she will observe her periods for a few months after her ablation to see what her periods will be like; it is recommended to wait until at least three months after the procedure before making conclusions about how periods are going to be like after an ablation.  To consider ablation as prefers no hormonal options. No fertility desired.  A total of 15 minutes were spent face-to-face with the patient during this encounter and over half of that time dealt with counseling and coordination of care.  Barnett Applebaum, MD, [facility], [facility] 02/24/2018  11:48 AM

## 2018-02-24 NOTE — Patient Instructions (Signed)
Endometrial Ablation Endometrial ablation is a procedure that destroys the thin inner layer of the lining of the uterus (endometrium). This procedure may be done:  To stop heavy periods.  To stop bleeding that is causing anemia.  To control irregular bleeding.  To treat bleeding caused by small tumors (fibroids) in the endometrium.  This procedure is often an alternative to major surgery, such as removal of the uterus and cervix (hysterectomy). As a result of this procedure:  You may not be able to have children. However, if you are premenopausal (you have not gone through menopause): ? You may still have a small chance of getting pregnant. ? You will need to use a reliable method of birth control after the procedure to prevent pregnancy.  You may stop having a menstrual period, or you may have only a small amount of bleeding during your period. Menstruation may return several years after the procedure.  Tell a health care provider about:  Any allergies you have.  All medicines you are taking, including vitamins, herbs, eye drops, creams, and over-the-counter medicines.  Any problems you or family members have had with the use of anesthetic medicines.  Any blood disorders you have.  Any surgeries you have had.  Any medical conditions you have. What are the risks? Generally, this is a safe procedure. However, problems may occur, including:  A hole (perforation) in the uterus or bowel.  Infection of the uterus, bladder, or vagina.  Bleeding.  Damage to other structures or organs.  An air bubble in the lung (air embolus).  Problems with pregnancy after the procedure.  Failure of the procedure.  Decreased ability to diagnose cancer in the endometrium.  What happens before the procedure?  You will have tests of your endometrium to make sure there are no pre-cancerous cells or cancer cells present.  You may have an ultrasound of the uterus.  You may be given  medicines to thin the endometrium.  Ask your health care provider about: ? Changing or stopping your regular medicines. This is especially important if you take diabetes medicines or blood thinners. ? Taking medicines such as aspirin and ibuprofen. These medicines can thin your blood. Do not take these medicines before your procedure if your doctor tells you not to.  Plan to have someone take you home from the hospital or clinic. What happens during the procedure?  You will lie on an exam table with your feet and legs supported as in a pelvic exam.  To lower your risk of infection: ? Your health care team will wash or sanitize their hands and put on germ-free (sterile) gloves. ? Your genital area will be washed with soap.  An IV tube will be inserted into one of your veins.  You will be given a medicine to help you relax (sedative).  A surgical instrument with a light and camera (resectoscope) will be inserted into your vagina and moved into your uterus. This allows your surgeon to see inside your uterus.  Endometrial tissue will be removed using one of the following methods: ? Radiofrequency. This method uses a radiofrequency-alternating electric current to remove the endometrium. ? Cryotherapy. This method uses extreme cold to freeze the endometrium. ? Heated-free liquid. This method uses a heated saltwater (saline) solution to remove the endometrium. ? Microwave. This method uses high-energy microwaves to heat up the endometrium and remove it. ? Thermal balloon. This method involves inserting a catheter with a balloon tip into the uterus. The balloon tip is   filled with heated fluid to remove the endometrium. The procedure may vary among health care providers and hospitals. What happens after the procedure?  Your blood pressure, heart rate, breathing rate, and blood oxygen level will be monitored until the medicines you were given have worn off.  As tissue healing occurs, you may  notice vaginal bleeding for 4-6 weeks after the procedure. You may also experience: ? Cramps. ? Thin, watery vaginal discharge that is light pink or brown in color. ? A need to urinate more frequently than usual. ? Nausea.  Do not drive for 24 hours if you were given a sedative.  Do not have sex or insert anything into your vagina until your health care provider approves. Summary  Endometrial ablation is done to treat the many causes of heavy menstrual bleeding.  The procedure may be done only after medications have been tried to control the bleeding.  Plan to have someone take you home from the hospital or clinic. This information is not intended to replace advice given to you by your health care provider. Make sure you discuss any questions you have with your health care provider. Document Released: 03/28/2004 Document Revised: 06/05/2016 Document Reviewed: 06/05/2016 Elsevier Interactive Patient Education  2017 [employer]  

## 2018-04-07 ENCOUNTER — Ambulatory Visit (INDEPENDENT_AMBULATORY_CARE_PROVIDER_SITE_OTHER): Payer: Managed Care, Other (non HMO) | Admitting: Obstetrics & Gynecology

## 2018-04-07 ENCOUNTER — Encounter: Payer: Self-pay | Admitting: Obstetrics & Gynecology

## 2018-04-07 ENCOUNTER — Ambulatory Visit: Payer: Managed Care, Other (non HMO) | Admitting: Obstetrics & Gynecology

## 2018-04-07 VITALS — BP 102/70 | Ht 64.0 in | Wt 169.0 lb

## 2018-04-07 DIAGNOSIS — E663 Overweight: Secondary | ICD-10-CM | POA: Diagnosis not present

## 2018-04-07 DIAGNOSIS — Z6829 Body mass index (BMI) 29.0-29.9, adult: Secondary | ICD-10-CM

## 2018-04-07 MED ORDER — PHENTERMINE HCL 15 MG PO CAPS: 15.0000 mg | ORAL_CAPSULE | Freq: Two times a day (BID) | ORAL | 0 refills | Status: DC

## 2018-04-07 MED ORDER — TOPIRAMATE 25 MG PO TABS: 25.0000 mg | ORAL_TABLET | Freq: Two times a day (BID) | ORAL | 6 refills | Status: DC

## 2018-04-07 NOTE — Patient Instructions (Signed)
Phentermine 15 mg TWICE daily- in am and at 2 pm Topamax 25 mg Twice daily

## 2018-04-07 NOTE — Progress Notes (Signed)
  History of Present Illness:  Desiree Green is a 32 y.o. who was started on Phentermine approximately 2 months ago due to obesity/abnormal weight gain. The patient has lost 7 pounds over the past 2 mos due to exercisr and meds..   She has these side effects: dry mouth and headache.  PMHx: She  has a past medical history of Cancer ([place]), Colitis, and Migraine. Also,  has a past surgical history that includes Dilation and curettage of uterus; Hernia repair; and Tubal ligation., family history includes Breast cancer in her maternal aunt; Colon cancer in her paternal grandfather; Hypertension in her father; Lung cancer in her paternal grandfather; Melanoma in her maternal grandmother; Ovarian cancer in her sister.,  reports that she has been smoking cigarettes. She has a 16.00 pack-year smoking history. She has never used smokeless tobacco. She reports that she does not drink alcohol or use drugs.  She has a current medication list which includes the following prescription(s): dicyclomine, fluticasone, phentermine, probiotic product, promethazine, rizatriptan, and topiramate. Also, is allergic to benadryl [diphenhydramine]; ibuprofen; and lactose intolerance (gi).  Review of Systems  All other systems reviewed and are negative.   Physical Exam:  BP 102/70   Ht 5\' 4"  (1.626 m)   Wt 169 lb (76.7 kg)   BMI 29.01 kg/m  Body mass index is 29.01 kg/m. Filed Weights   04/07/18 1145  Weight: 169 lb (76.7 kg)    Physical Exam  Constitutional: She is oriented to person, place, and time. She appears well-developed and well-nourished. No distress.  Musculoskeletal: Normal range of motion.  Neurological: She is alert and oriented to person, place, and time.  Skin: Skin is warm and dry.  Psychiatric: She has a normal mood and affect.  Vitals reviewed.   Assessment: overweight Medication treatment is going adequately for her.  Plan: Patient will be continued/added to prescription appetite  suppressants: Phentermine changed to 15 mg BID and also added Topamax 25 mg BID to see if continues toi lose weight w less headache side effects and continue exercise and dietary changes as well.   Will continue to assist patient in incorporating positive experiences into her life to promote a positive mental attitude.  Education given regarding appropriate lifestyle changes for weight loss, including regular physical activity, healthy coping strategies, caloric restriction, and healthy eating patterns.  The risks and benefits as well as side effects of medication, such as Phenteramine or Tenuate, is discussed.  The pros and cons of suppressing appetite and boosting metabolism is counseled.  Risks of tolerance and addiction discussed.  Use of medicine will be short term.  Pt to call with any negative side effects and agrees to keep follow up appointments.   A total of 15 minutes were spent face-to-face with the patient during this encounter and over half of that time dealt with counseling and coordination of care.  Barnett Applebaum, MD, [facility], [facility] 04/07/2018  12:07 PM

## 2018-05-06 ENCOUNTER — Ambulatory Visit: Payer: Managed Care, Other (non HMO) | Admitting: Obstetrics & Gynecology

## 2018-06-07 ENCOUNTER — Other Ambulatory Visit: Payer: Self-pay

## 2018-06-07 ENCOUNTER — Ambulatory Visit
Admission: EM | Admit: 2018-06-07 | Discharge: 2018-06-07 | Disposition: A | Discharge status: Home / Self Care | Payer: Managed Care, Other (non HMO) | Attending: Family Medicine | Admitting: Family Medicine

## 2018-06-07 ENCOUNTER — Encounter: Payer: Self-pay | Admitting: Emergency Medicine

## 2018-06-07 DIAGNOSIS — J069 Acute upper respiratory infection, unspecified: Secondary | ICD-10-CM | POA: Diagnosis not present

## 2018-06-07 NOTE — Discharge Instructions (Addendum)
Over the counter medication as discussed.  Rest. Drink plenty of fluids.   Follow up with your primary care physician this week as needed. Return to Urgent care for new or worsening concerns.

## 2018-06-07 NOTE — ED Provider Notes (Signed)
[facility] ____________________________________________  Time seen: Approximately 9:55 AM  I have reviewed the triage vital signs and the nursing notes.   HISTORY  Chief Complaint Sinus Problem (APPT)  HPI Desiree Green is a 33 y.o. female presenting for evaluation of nasal congestion, nasal drainage and intermittent fevers present for the last 3 days.  States cough started last night and this morning, more mild.  States symptoms started abruptly Friday morning, denies complaints prior to Friday.  Overall continues to eat and drink well.  States temperature has been just over 100 intermittently, but not persistently.  Has been taken over-the-counter TheraFlu combination with some improvement, no resolution.  States feels very congested in her head and some congestion in her ears.  Some sinus pressure.  Occasional chest tightness.  Reports some other sick contacts recently at home.  Denies chest pain, shortness of breath or abdominal pain.  Denies recent sickness.  Reports otherwise doing well.  Patient's last menstrual period was 05/27/2018 (exact date).denies pregnancy.  [place]: PCP    Past Medical History:  Diagnosis Date  . Cancer ([place])    uterine and cervical  . Colitis   . Migraine    1x/mo    Patient Active Problem List   Diagnosis Date Noted  . Metrorrhagia 02/24/2018  . Overweight (BMI 25.0-29.9) 02/24/2018  . Headache 06/22/2017  . Umbilical hernia 67/61/9509  . Disorder of sacrum 04/21/2012  . Low back pain 04/21/2012    Past Surgical History:  Procedure Laterality Date  . DILATION AND CURETTAGE OF UTERUS    . HERNIA REPAIR    . TUBAL LIGATION       No current facility-administered medications for this encounter.   Current Outpatient Medications:  .  fluticasone (FLONASE) 50 MCG/ACT nasal spray, Place 2 sprays into both nostrils daily., Disp: 16 g, Rfl: 0 .  phentermine 15 MG capsule, Take 1 capsule (15 mg total) by  mouth 2 (two) times daily., Disp: 60 capsule, Rfl: 0 .  rizatriptan (MAXALT) 5 MG tablet, Take 5 mg by mouth as needed for migraine. May repeat in 2 hours if needed, Disp: , Rfl:  .  topiramate (TOPAMAX) 25 MG tablet, Take 1 tablet (25 mg total) by mouth 2 (two) times daily., Disp: 60 tablet, Rfl: 6  Allergies Benadryl [diphenhydramine]; Ibuprofen; and Lactose intolerance (gi)  Family History  Problem Relation Age of Onset  . Breast cancer Maternal Aunt   . Hypertension Father   . Ovarian cancer Sister   . Melanoma Maternal Grandmother   . Colon cancer Paternal Grandfather   . Lung cancer Paternal Grandfather   . Thyroid disease Mother     Social History Social History   Tobacco Use  . Smoking status: Former Smoker    Packs/day: 1.00    Years: 16.00    Pack years: 16.00    Types: Cigarettes    Last attempt to quit: 05/07/2018    Years since quitting: 0.0  . Smokeless tobacco: Never Used  . Tobacco comment: started age 15.  currently says 2-3 cigs/day  Substance Use Topics  . Alcohol use: Yes    Comment: rarely  . Drug use: No    Review of Systems Constitutional: Positive fevers ENT: Denies sore throat.  Intermittent throat tickle. Cardiovascular: Denies chest pain. Respiratory: Denies shortness of breath. Gastrointestinal: No abdominal pain.  Occasional nausea.  No vomiting or diarrhea. Musculoskeletal: Negative for back pain. Skin: Negative for rash.   ____________________________________________   PHYSICAL  EXAM:  VITAL SIGNS: ED Triage Vitals  Enc Vitals Group     BP 06/07/18 0909 120/75     Pulse Rate 06/07/18 0909 77     Resp 06/07/18 0909 16     Temp 06/07/18 0909 98.4 F (36.9 C)     Temp Source 06/07/18 0909 Oral     SpO2 06/07/18 0909 100 %     Weight 06/07/18 0909 163 lb (73.9 kg)     Height 06/07/18 0909 5\' 4"  (1.626 m)     Head Circumference --      Peak Flow --      Pain Score 06/07/18 0908 6     Pain Loc --      Pain Edu? --      Excl.  in [place]? --     Constitutional: Alert and oriented. Well appearing and in no acute distress. Eyes: Conjunctivae are normal.  Head: Atraumatic.Mild to moderate tenderness to palpation bilateral frontal and maxillary sinuses. No swelling. No erythema.   Ears: no erythema, normal TMs bilaterally.   Nose: nasal congestion with bilateral nasal turbinate erythema and edema.   Mouth/Throat: Mucous membranes are moist.  Oropharynx non-erythematous.No tonsillar swelling or exudate.  Neck: No stridor.  No cervical spine tenderness to palpation. Hematological/Lymphatic/Immunilogical: No cervical lymphadenopathy. Cardiovascular: Normal rate, regular rhythm. Grossly normal heart sounds.  Good peripheral circulation. Respiratory: Normal respiratory effort.  No retractions.No wheezes, rales or rhonchi. Good air movement.  Musculoskeletal: Steady gait. Neurologic:  Normal speech and languageNo gait instability. Skin:  Skin is warm, dry and intact. No rash noted. Psychiatric: Mood and affect are normal. Speech and behavior are normal.  ___________________________________________   LABS (all labs ordered are listed, but only abnormal results are displayed)  Labs Reviewed - No data to display  PROCEDURES Procedures    INITIAL IMPRESSION / ASSESSMENT AND PLAN / ED COURSE  Pertinent labs & imaging results that were available during my care of the patient were reviewed by me and considered in my medical decision making (see chart for details).  Overall well-appearing patient.  No acute distress.  Suspect viral upper respiratory infection, possible influenza.  Past timeframe for Tamiflu.  Discussed over-the-counter supportive care and measures.  Denies need for cough medication.  Encourage rest, fluids, supportive care and close monitoring.  Work note given for today and tomorrow.  Discussed follow up with Primary care physician this week as needed. Discussed follow up and return parameters including no  resolution or any worsening concerns. Patient verbalized understanding and agreed to plan.   ____________________________________________   FINAL CLINICAL IMPRESSION(S) / ED DIAGNOSES  Final diagnoses:  Viral URI     ED Discharge Orders    None       Note: This dictation was prepared with Dragon dictation along with smaller phrase technology. Any transcriptional errors that result from this process are unintentional.         Marylene Land, NP 06/07/18 [phone]

## 2018-06-07 NOTE — ED Triage Notes (Signed)
Patient in today c/o sinus pressure and congestion x 3 days, getting worse. Patient has had fever of 100.1 off & on. Patient has tried OTC Tylenol cold/flu and Theraflu.

## 2018-07-15 ENCOUNTER — Ambulatory Visit
Admission: EM | Admit: 2018-07-15 | Discharge: 2018-07-15 | Disposition: A | Discharge status: Home / Self Care | Payer: Managed Care, Other (non HMO) | Attending: Family Medicine | Admitting: Family Medicine

## 2018-07-15 DIAGNOSIS — J069 Acute upper respiratory infection, unspecified: Secondary | ICD-10-CM | POA: Diagnosis not present

## 2018-07-15 LAB — RAPID STREP SCREEN (MED CTR MEBANE ONLY): STREPTOCOCCUS, GROUP A SCREEN (DIRECT): NEGATIVE

## 2018-07-15 MED ORDER — BENZONATATE 200 MG PO CAPS: ORAL_CAPSULE | ORAL | 0 refills | Status: DC

## 2018-07-15 MED ORDER — HYDROCOD POLST-CPM POLST ER 10-8 MG/5ML PO SUER: 5.0000 mL | Freq: Two times a day (BID) | ORAL | 0 refills | Status: DC

## 2018-07-15 NOTE — ED Provider Notes (Signed)
[facility]    CSN: [id number] Arrival date & time: 07/15/18  7062     History   Chief Complaint Chief Complaint  Patient presents with  . Sore Throat    HPI Desiree Green is a 33 y.o. female.   HPI  33 year old female presents with sore throat started yesterday is had associated chills but no measurable fever.  Woke this morning with a cough.  She did not receive her flu shot this year.  Vital signs this morning are 98.6 temperature pulse of 69 respirations 16 with O2 sats 100% on room air.          Past Medical History:  Diagnosis Date  . Cancer ([place])    uterine and cervical  . Colitis   . Migraine    1x/mo    Patient Active Problem List   Diagnosis Date Noted  . Metrorrhagia 02/24/2018  . Overweight (BMI 25.0-29.9) 02/24/2018  . Headache 06/22/2017  . Umbilical hernia 37/62/8315  . Disorder of sacrum 04/21/2012  . Low back pain 04/21/2012    Past Surgical History:  Procedure Laterality Date  . DILATION AND CURETTAGE OF UTERUS    . HERNIA REPAIR    . TUBAL LIGATION      OB History    Gravida  4   Para  2   Term  2   Preterm      AB  2   Living  2     SAB  1   TAB      Ectopic  1   Multiple      Live Births               Home Medications    Prior to Admission medications   Medication Sig Start Date End Date Taking? Authorizing Provider  benzonatate (TESSALON) 200 MG capsule Take one cap TID PRN cough 07/15/18   Lorin Picket, PA-C  chlorpheniramine-HYDROcodone [facility]) 10-8 MG/5ML SUER Take 5 mLs by mouth 2 (two) times daily. 07/15/18   Lorin Picket, PA-C  fluticasone (FLONASE) 50 MCG/ACT nasal spray Place 2 sprays into both nostrils daily. 07/25/16   Lorin Picket, PA-C  phentermine 15 MG capsule Take 1 capsule (15 mg total) by mouth 2 (two) times daily. 04/07/18   Gae Dry, MD  rizatriptan (MAXALT) 5 MG tablet Take 5 mg by mouth as needed for migraine. May repeat in 2  hours if needed    [provider]  topiramate (TOPAMAX) 25 MG tablet Take 1 tablet (25 mg total) by mouth 2 (two) times daily. 04/07/18   Gae Dry, MD    Family History Family History  Problem Relation Age of Onset  . Breast cancer Maternal Aunt   . Hypertension Father   . Ovarian cancer Sister   . Melanoma Maternal Grandmother   . Colon cancer Paternal Grandfather   . Lung cancer Paternal Grandfather   . Thyroid disease Mother     Social History Social History   Tobacco Use  . Smoking status: Former Smoker    Packs/day: 1.00    Years: 16.00    Pack years: 16.00    Types: Cigarettes    Last attempt to quit: 05/07/2018    Years since quitting: 0.1  . Smokeless tobacco: Never Used  . Tobacco comment: started age 62.  currently says 2-3 cigs/day  Substance Use Topics  . Alcohol use: Yes    Comment: rarely  . Drug use:  No     Allergies   Benadryl [diphenhydramine]; Ibuprofen; and Lactose intolerance (gi)   Review of Systems Review of Systems  Constitutional: Positive for activity change, chills and fatigue. Negative for fever.  HENT: Positive for congestion, sore throat and voice change.   Respiratory: Positive for cough.   All other systems reviewed and are negative.    Physical Exam Triage Vital Signs ED Triage Vitals  Enc Vitals Group     BP 07/15/18 0820 117/72     Pulse Rate 07/15/18 0820 69     Resp 07/15/18 0820 16     Temp 07/15/18 0820 98.6 F (37 C)     Temp Source 07/15/18 0820 Oral     SpO2 07/15/18 0820 100 %     Weight 07/15/18 0822 166 lb (75.3 kg)     Height 07/15/18 0822 5\' 4"  (1.626 m)     Head Circumference --      Peak Flow --      Pain Score 07/15/18 0822 6     Pain Loc --      Pain Edu? --      Excl. in [place]? --    No data found.  Updated Vital Signs BP 117/72 (BP Location: Left Arm)   Pulse 69   Temp 98.6 F (37 C) (Oral)   Resp 16   Ht 5\' 4"  (1.626 m)   Wt 166 lb (75.3 kg)   SpO2 100%   BMI 28.49 kg/m     Visual Acuity Right Eye Distance:   Left Eye Distance:   Bilateral Distance:    Right Eye Near:   Left Eye Near:    Bilateral Near:     Physical Exam Vitals signs and nursing note reviewed.  Constitutional:      General: She is not in acute distress.    Appearance: She is well-developed. She is not ill-appearing, toxic-appearing or diaphoretic.  HENT:     Head: Normocephalic.     Right Ear: Tympanic membrane and ear canal normal.     Left Ear: Tympanic membrane and ear canal normal.     Nose: Congestion present. No rhinorrhea.     Mouth/Throat:     Mouth: Mucous membranes are moist.     Pharynx: Oropharynx is clear.     Tonsils: No tonsillar exudate or tonsillar abscesses. Swelling: 0 on the right. 0 on the left.  Eyes:     Conjunctiva/sclera: Conjunctivae normal.  Neck:     Musculoskeletal: Normal range of motion.  Pulmonary:     Effort: Pulmonary effort is normal.     Breath sounds: Normal breath sounds.  Lymphadenopathy:     Cervical: No cervical adenopathy.  Skin:    General: Skin is warm and dry.  Neurological:     General: No focal deficit present.     Mental Status: She is alert and oriented to person, place, and time.  Psychiatric:        Mood and Affect: Mood normal.        Behavior: Behavior normal.      UC Treatments / Results  Labs (all labs ordered are listed, but only abnormal results are displayed) Labs Reviewed  RAPID STREP SCREEN ([facility] ONLY)  CULTURE, GROUP A STREP [facility])    EKG None  Radiology No results found.  Procedures Procedures (including critical care time)  Medications Ordered in UC Medications - No data to display  Initial Impression / Assessment and Plan / UC Course  I have reviewed the triage vital signs and the nursing notes.  Pertinent labs & imaging results that were available during my care of the patient were reviewed by me and considered in my medical decision making (see chart for details).   The  patient that she has an upper respiratory infection.  Will treat her symptomatically.  She needs to drink plenty of fluids get plenty of rest and use Tylenol Motrin for chills body aches.  She was given caution regarding Tussionex cough syrup and should use only at nighttime.  If she not improving she may return to our clinic or follow-up with primary    Final Clinical Impressions(s) / UC Diagnoses   Final diagnoses:  Upper respiratory tract infection, unspecified type     Discharge Instructions     Drink plenty of fluids.  Rest as much as possible.  Use Tylenol or Motrin for fever chills or body aches.    ED Prescriptions    Medication Sig Dispense Auth. Provider   chlorpheniramine-HYDROcodone (TUSSIONEX PENNKINETIC ER) 10-8 MG/5ML SUER Take 5 mLs by mouth 2 (two) times daily. 115 mL Crecencio Mc P, PA-C   benzonatate (TESSALON) 200 MG capsule Take one cap TID PRN cough 30 capsule Lorin Picket, PA-C     Controlled Substance Prescriptions [place] Controlled Substance Registry consulted? Not Applicable   Lorin Picket, PA-C 07/15/18 4709

## 2018-07-15 NOTE — Discharge Instructions (Addendum)
Drink plenty of fluids.  Rest as much as possible.  Use Tylenol or Motrin for fever chills or body aches.  °

## 2018-07-15 NOTE — ED Triage Notes (Signed)
Pt reports sore throat that started yesterday associated with chills. Pt also reports awoke this morning with cough

## 2018-07-17 LAB — CULTURE, GROUP A STREP (THRC)

## 2018-07-18 ENCOUNTER — Telehealth: Payer: Managed Care, Other (non HMO) | Admitting: Family

## 2018-07-18 DIAGNOSIS — J069 Acute upper respiratory infection, unspecified: Secondary | ICD-10-CM

## 2018-07-18 MED ORDER — BENZONATATE 100 MG PO CAPS: 100.0000 mg | ORAL_CAPSULE | Freq: Three times a day (TID) | ORAL | 0 refills | Status: DC | PRN

## 2018-07-18 MED ORDER — PREDNISONE 10 MG (21) PO TBPK: ORAL_TABLET | ORAL | 0 refills | Status: DC

## 2018-07-18 NOTE — Progress Notes (Signed)
We are sorry that you are not feeling well.  Here is how we plan to help!  Based on your presentation I believe you most likely have A cough due to a virus.  This is called viral bronchitis and is best treated by rest, plenty of fluids and control of the cough.  You may use Ibuprofen or Tylenol as directed to help your symptoms.     In addition you may use A non-prescription cough medication called Robitussin DAC. Take 2 teaspoons every 8 hours or Delsym: take 2 teaspoons every 12 hours., A non-prescription cough medication called Mucinex DM: take 2 tablets every 12 hours. and A prescription cough medication called Tessalon Perles 100mg . You may take 1-2 capsules every 8 hours as needed for your cough.  Prednisone 10 mg daily for 6 days (see taper instructions below)   **If your symptoms do not improve with the steroids, you will need to follow up face-to-face to be evaluated.   Approximately, 7 mins spent reviewing and documenting in patient's chart.   Directions for 6 day taper: Day 1: 2 tablets before breakfast, 1 after both lunch & dinner and 2 at bedtime Day 2: 1 tab before breakfast, 1 after both lunch & dinner and 2 at bedtime Day 3: 1 tab at each meal & 1 at bedtime Day 4: 1 tab at breakfast, 1 at lunch, 1 at bedtime Day 5: 1 tab at breakfast & 1 tab at bedtime Day 6: 1 tab at breakfast   From your responses in the eVisit questionnaire you describe inflammation in the upper respiratory tract which is causing a significant cough.  This is commonly called Bronchitis and has four common causes:    Allergies  Viral Infections  Acid Reflux  Bacterial Infection Allergies, viruses and acid reflux are treated by controlling symptoms or eliminating the cause. An example might be a cough caused by taking certain blood pressure medications. You stop the cough by changing the medication. Another example might be a cough caused by acid reflux. Controlling the reflux helps control the  cough.  USE OF BRONCHODILATOR ("RESCUE") INHALERS: There is a risk from using your bronchodilator too frequently.  The risk is that over-reliance on a medication which only relaxes the muscles surrounding the breathing tubes can reduce the effectiveness of medications prescribed to reduce swelling and congestion of the tubes themselves.  Although you feel brief relief from the bronchodilator inhaler, your asthma may actually be worsening with the tubes becoming more swollen and filled with mucus.  This can delay other crucial treatments, such as oral steroid medications. If you need to use a bronchodilator inhaler daily, several times per day, you should discuss this with your provider.  There are probably better treatments that could be used to keep your asthma under control.     HOME CARE . Only take medications as instructed by your medical team. . Complete the entire course of an antibiotic. . Drink plenty of fluids and get plenty of rest. . Avoid close contacts especially the very young and the elderly . Cover your mouth if you cough or cough into your sleeve. . Always remember to wash your hands . A steam or ultrasonic humidifier can help congestion.   GET HELP RIGHT AWAY IF: . You develop worsening fever. . You become short of breath . You cough up blood. . Your symptoms persist after you have completed your treatment plan MAKE SURE YOU   Understand these instructions.  Will watch your condition.  Will get help right away if you are not doing well or get worse.  Your e-visit answers were reviewed by a board certified advanced clinical practitioner to complete your personal care plan.  Depending on the condition, your plan could have included both over the counter or prescription medications. If there is a problem please reply  once you have received a response from your provider. Your safety is important to [place].  If you have drug allergies check your prescription carefully.     You can use MyChart to ask questions about today's visit, request a non-urgent call back, or ask for a work or school excuse for 24 hours related to this e-Visit. If it has been greater than 24 hours you will need to follow up with your provider, or enter a new e-Visit to address those concerns. You will get an e-mail in the next two days asking about your experience.  I hope that your e-visit has been valuable and will speed your recovery. Thank you for using e-visits.

## 2018-07-19 ENCOUNTER — Telehealth (HOSPITAL_COMMUNITY): Payer: Self-pay | Admitting: Emergency Medicine

## 2018-07-19 NOTE — Telephone Encounter (Signed)
Culture is positive for non group A Strep germ.  This is a finding of uncertain significance; not the typical 'strep throat' germ.  Attempted to reach patient. No answer at this time.

## 2018-10-28 ENCOUNTER — Other Ambulatory Visit: Payer: Self-pay

## 2018-10-28 ENCOUNTER — Ambulatory Visit
Admission: EM | Admit: 2018-10-28 | Discharge: 2018-10-28 | Disposition: A | Discharge status: Home / Self Care | Payer: Managed Care, Other (non HMO) | Attending: Urgent Care | Admitting: Urgent Care

## 2018-10-28 ENCOUNTER — Encounter: Payer: Self-pay | Admitting: Emergency Medicine

## 2018-10-28 DIAGNOSIS — L237 Allergic contact dermatitis due to plants, except food: Secondary | ICD-10-CM | POA: Diagnosis not present

## 2018-10-28 MED ORDER — CALAMINE EX LOTN: 1.0000 "application " | TOPICAL_LOTION | CUTANEOUS | 0 refills | Status: DC | PRN

## 2018-10-28 MED ORDER — PREDNISONE 10 MG (21) PO TBPK: ORAL_TABLET | ORAL | 0 refills | Status: DC

## 2018-10-28 MED ORDER — HYDROXYZINE HCL 25 MG PO TABS: 25.0000 mg | ORAL_TABLET | Freq: Two times a day (BID) | ORAL | 0 refills | Status: DC | PRN

## 2018-10-28 MED ORDER — DEXAMETHASONE SODIUM PHOSPHATE 10 MG/ML IJ SOLN
10.0000 mg | Freq: Once | INTRAMUSCULAR | Status: AC
  Administered 2018-10-28: 10:00:00 10 mg via INTRAMUSCULAR

## 2018-10-28 NOTE — Discharge Instructions (Addendum)
It was very nice meeting you today in clinic. Thank you for entrusting me with your care.   As discussed, we covered you with steroids in clinic. I still would like for you to take the oral steroids given the extent of your rash. Start those tomorrow. I gave you something for itching since you are unable to use benadryl.   Please utilize the medications that we discussed. Your prescriptions have been called in to your pharmacy.  Calamine lotion will help soothe.   Make arrangements to follow up with your regular doctor in within the next few days if not improving. If your symptoms/condition worsens, please seek follow up care either here or in the ER. Please remember, our [place] providers are "right here with you" when you need [place].   Again, it was my pleasure to take care of you today. Thank you for choosing our clinic. I hope that you start to feel better quickly.   Honor Loh, MSN, APRN, FNP-C, CEN Advanced Practice Provider [facility]

## 2018-10-28 NOTE — ED Provider Notes (Signed)
[address], [place], [place] [address] [phone]   Name: Desiree Green DOB: February 06, 1986 MRN: [id number] CSN: [id number] PCP: [facility]  Arrival date and time:  10/28/18 8338  Chief Complaint:  Poison Ivy (APPT)  NOTE: Prior to seeing the patient today, I have reviewed the triage nursing documentation and vital signs. Clinical staff has updated patient's PMH/PSHx, current medication list, and drug allergies/intolerances to ensure comprehensive history available to assist in medical decision making.   History:   HPI: Desiree Green is a 33 y.o. female who presents today with complaints of a rash to her face that declared on 10/26/2018.  Patient notes that she was working outside in the yard on [place] [employer] (10/25/2018) and was exposed to either poison sumac or poison ivy.  Patient states, "If I had to bet, I would lean more towards sumac though because we have had problems with that in the yard before". Patient notes that pruritic rash started to LEFT upper extremity on Tuesday morning, and has since then progressed to her other arm, hands, under RIGHT breast, neck, back, and face.  Patient denies any associated shortness of breath, difficulty swallowing, or the sensation of laryngeal fullness. RIGHT eye began swelling yesterday, with the LEFT eye following today; no visual changes.   Past Medical History:  Diagnosis Date  . Cancer ([place])    uterine and cervical  . Colitis   . Migraine    1x/mo    Past Surgical History:  Procedure Laterality Date  . DILATION AND CURETTAGE OF UTERUS    . HERNIA REPAIR    . TUBAL LIGATION      Family History  Problem Relation Age of Onset  . Breast cancer Maternal Aunt   . Hypertension Father   . Ovarian cancer Sister   . Melanoma Maternal Grandmother   . Colon cancer Paternal Grandfather   . Lung cancer Paternal Grandfather   . Thyroid disease Mother     Social History   Socioeconomic History  .  Marital status: Married    Spouse name: Not on file  . Number of children: Not on file  . Years of education: Not on file  . Highest education level: Not on file  Occupational History  . Not on file  Social Needs  . Financial resource strain: Not on file  . Food insecurity:    Worry: Not on file    Inability: Not on file  . Transportation needs:    Medical: Not on file    Non-medical: Not on file  Tobacco Use  . Smoking status: Former Smoker    Packs/day: 1.00    Years: 16.00    Pack years: 16.00    Types: Cigarettes    Last attempt to quit: 05/07/2018    Years since quitting: 0.4  . Smokeless tobacco: Never Used  . Tobacco comment: started age 71.  currently says 2-3 cigs/day  Substance and Sexual Activity  . Alcohol use: Yes    Comment: rarely  . Drug use: No  . Sexual activity: Not on file  Lifestyle  . Physical activity:    Days per week: Not on file    Minutes per session: Not on file  . Stress: Not on file  Relationships  . Social connections:    Talks on phone: Not on file    Gets together: Not on file    Attends religious service: Not on file    Active member of club or organization:  Not on file    Attends meetings of clubs or organizations: Not on file    Relationship status: Not on file  . Intimate partner violence:    Fear of current or ex partner: Not on file    Emotionally abused: Not on file    Physically abused: Not on file    Forced sexual activity: Not on file  Other Topics Concern  . Not on file  Social History Narrative  . Not on file    Patient Active Problem List   Diagnosis Date Noted  . Metrorrhagia 02/24/2018  . Overweight (BMI 25.0-29.9) 02/24/2018  . Headache 06/22/2017  . Umbilical hernia 10/62/6948  . Disorder of sacrum 04/21/2012  . Low back pain 04/21/2012    Home Medications:    No outpatient medications have been marked as taking for the 10/28/18 encounter [facility] Encounter).    Allergies:   Benadryl  [diphenhydramine]; Ibuprofen; and Lactose intolerance (gi)  Review of Systems (ROS): Review of Systems  Constitutional: Negative for chills and fever.  HENT: Negative for drooling and trouble swallowing.   Eyes: Positive for itching. Negative for photophobia, redness and visual disturbance.  Respiratory: Negative for cough, chest tightness and shortness of breath.   Cardiovascular: Negative for chest pain and palpitations.  Skin: Rash: 2/2 contact dermatitis.     Physical Exam:  Triage Vital Signs ED Triage Vitals  Enc Vitals Group     BP 10/28/18 1008 114/80     Pulse Rate 10/28/18 1008 79     Resp 10/28/18 1008 18     Temp 10/28/18 1008 98.7 F (37.1 C)     Temp Source 10/28/18 1008 Oral     SpO2 10/28/18 1008 100 %     Weight 10/28/18 1006 160 lb (72.6 kg)     Height 10/28/18 1006 5\' 4"  (1.626 m)     Head Circumference --      Peak Flow --      Pain Score 10/28/18 1006 7     Pain Loc --      Pain Edu? --      Excl. in [place]? --     Physical Exam  Constitutional: She is oriented to person, place, and time and well-developed, well-nourished, and in no distress.  HENT:  Head: Normocephalic and atraumatic.  Mouth/Throat: Oropharynx is clear and moist and mucous membranes are normal. No posterior oropharyngeal edema or posterior oropharyngeal erythema.  Eyes: Pupils are equal, round, and reactive to light. Conjunctivae and EOM are normal. Lids are everted and swept, no foreign bodies found. Right eye exhibits no exudate. Left eye exhibits no exudate. No scleral icterus. Right eye exhibits normal extraocular motion. Left eye exhibits normal extraocular motion.  (+) upper lid swelling. (+) peri-orbital rash  Neck: Normal range of motion. Neck supple. No tracheal deviation present.  Cardiovascular: Normal rate, regular rhythm, normal heart sounds and intact distal pulses. Exam reveals no gallop and no friction rub.  No murmur heard. Pulmonary/Chest: Effort normal and breath sounds  normal. No accessory muscle usage. No respiratory distress. She has no wheezes.  Neurological: She is alert and oriented to person, place, and time.  Skin: Skin is warm and dry. Rash noted. Rash is maculopapular (BILATERAL hands and arms, anterior neck, face, upper back, and beneath RIGHT breast).  Psychiatric: Mood, affect and judgment normal.  Nursing note and vitals reviewed.    Urgent Care Treatments / Results:   LABS: PLEASE NOTE: all labs that were ordered this encounter are  listed, however only abnormal results are displayed. Labs Reviewed - No data to display  EKG: -None  RADIOLOGY: No results found.  PRODEDURES: Procedures  MEDICATIONS RECEIVED THIS VISIT: Medications  dexamethasone (DECADRON) injection 10 mg (10 mg Intramuscular Given 10/28/18 1019)    PERTINENT CLINICAL COURSE NOTES/UPDATES: No data to display   Initial Impression / Assessment and Plan / Urgent Care Course:    Kaislee Chao is a 33 y.o. female who presents to [facility] today with complaints of Poison Ivy (APPT)  Pertinent labs & imaging results that were available during my care of the patient were personally reviewed by me and considered in my medical decision making (see lab/imaging section of note for values and interpretations).  Exam reveals diffuse maculopapular rash to upper extremities, neck, back, and face.  Rash has spread since it first declared on 10/26/2018.  Patient has not used any interventions at home.  Rash reported to be pruritic.  She denies any associated shortness of breath or difficulties swallowing.  Symptoms consistent with contact dermatitis secondary to exposure to poison sumac.  Wet given the extent of her rash near her mouth and eyes, will treat with dexamethasone 10 mg IM in clinic. Will also cover with an oral steroid taper starting on 10/29/2018.  Patient unable to use diphenhydramine, and other associated antihistamines, citing the fact that they cause her to  "break out".  Will prescribe patient a short course of Atarax to use on a PRN basis.  Recommended calamine lotion to help soothe skin and help with itching.  Patient educated on need to avoid scratching, and to wash all clothing and bed linens in hot water to prevent further transmission of contact dermatitis related to poison sumac.  Current clinical condition warrants patient being out of work in order to recover from her current injury/illness. She was provided with the appropriate documentation to provide to her place of employment that will allow for her to RTW on 10/29/2018 with no restrictions.   Discussed follow up with primary care physician in the next few days for re-evaluation if not improving.  I have reviewed the follow up and strict return precautions for any new or worsening symptoms. Patient is aware of symptoms that would be deemed urgent/emergent, and would thus require further evaluation either here or in the emergency department. At the time of discharge, she verbalized understanding and consent with the discharge plan as it was reviewed with her. All questions were fielded by provider and/or clinic staff prior to patient discharge.    Final Clinical Impressions(s) / Urgent Care Diagnoses:   Final diagnoses:  Allergic dermatitis due to poison sumac    New Prescriptions:   Meds ordered this encounter  Medications  . dexamethasone (DECADRON) injection 10 mg  . predniSONE (STERAPRED UNI-PAK 21 TAB) 10 MG (21) TBPK tablet    Sig: 60 mg x 1 day, 50 mg x 1 day, 40 mg x 1 day, 30 mg x 1 day, 20 mg x 1 day, 10 mg x 1 day. START on 10/29/2018.    Dispense:  21 tablet    Refill:  0  . hydrOXYzine (ATARAX/VISTARIL) 25 MG tablet    Sig: Take 1 tablet (25 mg total) by mouth 2 (two) times daily as needed.    Dispense:  10 tablet    Refill:  0  . calamine lotion    Sig: Apply 1 application topically as needed for itching.    Dispense:  120 mL  Refill:  0    Controlled Substance  Prescriptions:  [place] Controlled Substance Registry consulted? Not Applicable  NOTE: This note was prepared using Dragon dictation software along with smaller phrase technology. Despite my best ability to proofread, there is the potential that transcriptional errors may still occur from this process, and are completely unintentional.     Karen Kitchens, NP 10/28/18 1051

## 2018-10-28 NOTE — ED Triage Notes (Signed)
Patient c/o poison ivy to her face and body that started on Tuesday.

## 2019-03-29 ENCOUNTER — Other Ambulatory Visit: Payer: Self-pay

## 2019-03-29 DIAGNOSIS — Z20822 Contact with and (suspected) exposure to covid-19: Secondary | ICD-10-CM

## 2019-03-30 LAB — NOVEL CORONAVIRUS, NAA: SARS-CoV-2, NAA: NOT DETECTED

## 2019-06-07 ENCOUNTER — Other Ambulatory Visit: Payer: Self-pay

## 2019-06-07 ENCOUNTER — Encounter: Payer: Self-pay | Admitting: Emergency Medicine

## 2019-06-07 ENCOUNTER — Ambulatory Visit
Admission: EM | Admit: 2019-06-07 | Discharge: 2019-06-07 | Disposition: A | Discharge status: Home / Self Care | Payer: Managed Care, Other (non HMO) | Attending: Emergency Medicine | Admitting: Emergency Medicine

## 2019-06-07 DIAGNOSIS — R5383 Other fatigue: Secondary | ICD-10-CM | POA: Diagnosis not present

## 2019-06-07 DIAGNOSIS — R6883 Chills (without fever): Secondary | ICD-10-CM | POA: Diagnosis not present

## 2019-06-07 DIAGNOSIS — R519 Headache, unspecified: Secondary | ICD-10-CM

## 2019-06-07 DIAGNOSIS — R0981 Nasal congestion: Secondary | ICD-10-CM

## 2019-06-07 DIAGNOSIS — B349 Viral infection, unspecified: Secondary | ICD-10-CM

## 2019-06-07 DIAGNOSIS — Z0189 Encounter for other specified special examinations: Secondary | ICD-10-CM

## 2019-06-07 NOTE — ED Triage Notes (Signed)
Patient in office c/o chills, bodyache,sinus issue and headache x 06/04/19  OTC: Aleve

## 2019-06-07 NOTE — ED Provider Notes (Signed)
Desiree Green    CSN: [id number] Arrival date & time: 06/07/19  1050      History   Chief Complaint Chief Complaint  Patient presents with  . Chills  . Headache  . Generalized Body Aches    HPI Desiree Green is a 34 y.o. female.   Patient presents with chills, fatigue, body aches, sinus pressure, postnasal drip, sinus headache x2 to 3 days.  She has attempted treatment at home with Aleve.  She denies fever, ear pain, sore throat, cough, shortness of breath, vomiting, diarrhea, rash, or other symptoms.   The history is provided by the patient.    Past Medical History:  Diagnosis Date  . Cancer ([place])    uterine and cervical  . Colitis   . Migraine    1x/mo    Patient Active Problem List   Diagnosis Date Noted  . Metrorrhagia 02/24/2018  . Overweight (BMI 25.0-29.9) 02/24/2018  . Headache 06/22/2017  . Umbilical hernia [id number]  . Disorder of sacrum 04/21/2012  . Low back pain 04/21/2012    Past Surgical History:  Procedure Laterality Date  . DILATION AND CURETTAGE OF UTERUS    . HERNIA REPAIR    . TUBAL LIGATION      OB History    Gravida  4   Para  2   Term  2   Preterm      AB  2   Living  2     SAB  1   TAB      Ectopic  1   Multiple      Live Births               Home Medications    Prior to Admission medications   Medication Sig Start Date End Date Taking? Authorizing Provider  calamine lotion Apply 1 application topically as needed for itching. 10/28/18   Karen Kitchens, NP  hydrOXYzine (ATARAX/VISTARIL) 25 MG tablet Take 1 tablet (25 mg total) by mouth 2 (two) times daily as needed. 10/28/18   Karen Kitchens, NP  predniSONE (STERAPRED UNI-PAK 21 TAB) 10 MG (21) TBPK tablet 60 mg x 1 day, 50 mg x 1 day, 40 mg x 1 day, 30 mg x 1 day, 20 mg x 1 day, 10 mg x 1 day. START on 10/29/2018. 10/28/18   Karen Kitchens, NP    Family History Family History  Problem Relation Age of Onset  . Breast cancer Maternal Aunt   .  Hypertension Father   . Ovarian cancer Sister   . Melanoma Maternal Grandmother   . Colon cancer Paternal Grandfather   . Lung cancer Paternal Grandfather   . Thyroid disease Mother     Social History Social History   Tobacco Use  . Smoking status: Former Smoker    Packs/day: 1.00    Years: 16.00    Pack years: 16.00    Types: Cigarettes    Quit date: 05/07/2018    Years since quitting: 1.0  . Smokeless tobacco: Never Used  . Tobacco comment: started age 70.  currently says 2-3 cigs/day  Substance Use Topics  . Alcohol use: Yes    Comment: rarely  . Drug use: No     Allergies   Benadryl [diphenhydramine], Ibuprofen, and Lactose intolerance (gi)   Review of Systems Review of Systems  Constitutional: Positive for chills and fatigue. Negative for fever.  HENT: Positive for congestion, postnasal drip and sinus pressure. Negative for ear pain  and sore throat.   Eyes: Negative for pain and visual disturbance.  Respiratory: Negative for cough and shortness of breath.   Cardiovascular: Negative for chest pain and palpitations.  Gastrointestinal: Negative for abdominal pain and vomiting.  Genitourinary: Negative for dysuria and hematuria.  Musculoskeletal: Negative for arthralgias and back pain.  Skin: Negative for color change and rash.  Neurological: Positive for headaches. Negative for dizziness, seizures, syncope, facial asymmetry, speech difficulty, weakness and numbness.  All other systems reviewed and are negative.    Physical Exam Triage Vital Signs ED Triage Vitals  Enc Vitals Group     BP      Pulse      Resp      Temp      Temp src      SpO2      Weight      Height      Head Circumference      Peak Flow      Pain Score      Pain Loc      Pain Edu?      Excl. in [place]?    No data found.  Updated Vital Signs BP 121/75 (BP Location: Left Arm)   Pulse 68   Temp 98.2 F (36.8 C) (Oral)   Resp 16   Wt 161 lb (73 kg)   LMP 05/12/2019   SpO2 98%    BMI 27.64 kg/m   Visual Acuity Right Eye Distance:   Left Eye Distance:   Bilateral Distance:    Right Eye Near:   Left Eye Near:    Bilateral Near:     Physical Exam Vitals and nursing note reviewed.  Constitutional:      General: She is not in acute distress.    Appearance: She is well-developed.  HENT:     Head: Normocephalic and atraumatic.     Right Ear: Tympanic membrane normal.     Left Ear: Tympanic membrane normal.     Nose: Nose normal.     Mouth/Throat:     Mouth: Mucous membranes are moist.     Pharynx: Oropharynx is clear.  Eyes:     Conjunctiva/sclera: Conjunctivae normal.  Cardiovascular:     Rate and Rhythm: Normal rate and regular rhythm.     Heart sounds: No murmur.  Pulmonary:     Effort: Pulmonary effort is normal. No respiratory distress.     Breath sounds: Normal breath sounds.  Abdominal:     General: Bowel sounds are normal.     Palpations: Abdomen is soft.     Tenderness: There is no abdominal tenderness. There is no guarding or rebound.  Musculoskeletal:     Cervical back: Neck supple.  Skin:    General: Skin is warm and dry.     Findings: No rash.  Neurological:     General: No focal deficit present.     Mental Status: She is alert and oriented to person, place, and time.  Psychiatric:        Mood and Affect: Mood normal.        Behavior: Behavior normal.      UC Treatments / Results  Labs (all labs ordered are listed, but only abnormal results are displayed) Labs Reviewed  NOVEL CORONAVIRUS, NAA    EKG   Radiology No results found.  Procedures Procedures (including critical care time)  Medications Ordered in UC Medications - No data to display  Initial Impression / Assessment and Plan / UC Course  I have reviewed the triage vital signs and the nursing notes.  Pertinent labs & imaging results that were available during my care of the patient were reviewed by me and considered in my medical decision making (see chart  for details).   Viral illness, patient request for COVID test.  Instructed patient to take Tylenol and Mucinex as needed for her symptoms.  COVID test performed here.  Instructed patient to self quarantine until the test result is back.  Instructed patient to go to the emergency department if she develops high fever, shortness of breath, severe diarrhea, or other concerning symptoms.  Patient agrees with plan of care.    Final Clinical Impressions(s) / UC Diagnoses   Final diagnoses:  Patient request for diagnostic testing  Viral illness     Discharge Instructions     Take Tylenol and Mucinex as needed for your symptoms.    Your COVID test is pending.  You should self quarantine until your test result is back and is negative.    Go to the emergency department if you develop high fever, shortness of breath, severe diarrhea, or other concerning symptoms.        ED Prescriptions    None     PDMP not reviewed this encounter.   Sharion Balloon, NP 06/07/19 1147

## 2019-06-07 NOTE — Discharge Instructions (Addendum)
Take Tylenol and Mucinex as needed for your symptoms.    Your COVID test is pending.  You should self quarantine until your test result is back and is negative.    Go to the emergency department if you develop high fever, shortness of breath, severe diarrhea, or other concerning symptoms.

## 2019-06-09 LAB — NOVEL CORONAVIRUS, NAA: SARS-CoV-2, NAA: NOT DETECTED

## 2019-09-15 ENCOUNTER — Encounter: Payer: Self-pay | Admitting: Emergency Medicine

## 2019-09-15 ENCOUNTER — Emergency Department
Admission: EM | Admit: 2019-09-15 | Discharge: 2019-09-15 | Disposition: A | Discharge status: Home / Self Care | Payer: Managed Care, Other (non HMO) | Attending: Emergency Medicine | Admitting: Emergency Medicine

## 2019-09-15 ENCOUNTER — Emergency Department: Payer: Managed Care, Other (non HMO)

## 2019-09-15 ENCOUNTER — Other Ambulatory Visit: Payer: Self-pay

## 2019-09-15 ENCOUNTER — Ambulatory Visit
Admission: RE | Admit: 2019-09-15 | Discharge: 2019-09-15 | Discharge status: Absent without leave | Payer: Managed Care, Other (non HMO) | Source: Ambulatory Visit

## 2019-09-15 DIAGNOSIS — T50Z95A Adverse effect of other vaccines and biological substances, initial encounter: Secondary | ICD-10-CM

## 2019-09-15 DIAGNOSIS — R079 Chest pain, unspecified: Secondary | ICD-10-CM | POA: Insufficient documentation

## 2019-09-15 DIAGNOSIS — F17210 Nicotine dependence, cigarettes, uncomplicated: Secondary | ICD-10-CM | POA: Insufficient documentation

## 2019-09-15 DIAGNOSIS — Z8541 Personal history of malignant neoplasm of cervix uteri: Secondary | ICD-10-CM | POA: Diagnosis not present

## 2019-09-15 DIAGNOSIS — R0602 Shortness of breath: Secondary | ICD-10-CM | POA: Diagnosis not present

## 2019-09-15 DIAGNOSIS — R519 Headache, unspecified: Secondary | ICD-10-CM | POA: Insufficient documentation

## 2019-09-15 DIAGNOSIS — T50B95A Adverse effect of other viral vaccines, initial encounter: Secondary | ICD-10-CM | POA: Insufficient documentation

## 2019-09-15 LAB — BASIC METABOLIC PANEL
Anion gap: 9 (ref 5–15)
BUN: 10 mg/dL (ref 6–20)
CO2: 25 mmol/L (ref 22–32)
Calcium: 9.4 mg/dL (ref 8.9–10.3)
Chloride: 103 mmol/L (ref 98–111)
Creatinine, Ser: 0.72 mg/dL (ref 0.44–1.00)
GFR calc Af Amer: >60 mL/min (ref >60)
GFR calc non Af Amer: >60 mL/min (ref >60)
Glucose, Bld: 98 mg/dL (ref 70–99)
Potassium: 4.2 mmol/L (ref 3.5–5.1)
Sodium: 137 mmol/L (ref 135–145)

## 2019-09-15 LAB — CBC
HCT: 39.6 % (ref 36.0–46.0)
Hemoglobin: 12.4 g/dL (ref 12.0–15.0)
MCH: 25.9 pg — ABNORMAL LOW (ref 26.0–34.0)
MCHC: 31.3 g/dL (ref 30.0–36.0)
MCV: 82.8 fL (ref 80.0–100.0)
Platelets: 236 10*3/uL (ref 150–400)
RBC: 4.78 MIL/uL (ref 3.87–5.11)
RDW: 14 % (ref 11.5–15.5)
WBC: 5.3 10*3/uL (ref 4.0–10.5)
nRBC: 0 % (ref 0.0–0.2)

## 2019-09-15 LAB — TROPONIN I (HIGH SENSITIVITY)
Troponin I (High Sensitivity): <2 ng/L (ref <18)
Troponin I (High Sensitivity): 12 ng/L (ref <18)

## 2019-09-15 LAB — POCT PREGNANCY, URINE: Preg Test, Ur: NEGATIVE

## 2019-09-15 MED ORDER — IOHEXOL 350 MG/ML SOLN
75.0000 mL | Freq: Once | INTRAVENOUS | Status: AC | PRN
  Administered 2019-09-15: 14:00:00 75 mL via INTRAVENOUS

## 2019-09-15 MED ORDER — ACETAMINOPHEN 500 MG PO TABS
1000.0000 mg | ORAL_TABLET | Freq: Once | ORAL | Status: AC
  Administered 2019-09-15: 1000 mg via ORAL
  Filled 2019-09-15: qty 2

## 2019-09-15 NOTE — ED Provider Notes (Signed)
[facility] Emergency Department Provider Note  ____________________________________________   First MD Initiated Contact with Patient 09/15/19 1353     (approximate)  I have reviewed the triage vital signs and the nursing notes.   HISTORY  Chief Complaint Headache    HPI Desiree Green is a 34 y.o. female with history of uterine and cervical cancer at age 71 in remission who had her [place] vaccine 1 week ago who comes in with multiple symptoms.  Patient reports that initially after the vaccine she had the typical symptoms of fevers, mild headaches.  However she then started to develop a tingling sensation a few days ago from her elbow down on her left arm where it felt heavy in nature.  She also developed headaches that were more severe than her normal, currently moderate, nothing makes better, nothing makes it worse.  Occasionally associated with some sharp pain that goes down the back of her neck and down the back of the left arm associated with tingling.  She also then today developed some chest pain in the middle of her chest with a little bit of shortness of breath with it.  She is unsure if the pain is worse with breathing but she states that it is abnormal for her to have chest pain.  No swelling in extremities, no recent travel, no history of blood clots, no history of estrogen use.  Patient underwent a tubal ligation.           Past Medical History:  Diagnosis Date  . Cancer ([place])    uterine and cervical  . Colitis   . Migraine    1x/mo    Patient Active Problem List   Diagnosis Date Noted  . Metrorrhagia 02/24/2018  . Overweight (BMI 25.0-29.9) 02/24/2018  . Headache 06/22/2017  . Umbilical hernia [id number]  . Disorder of sacrum 04/21/2012  . Low back pain 04/21/2012    Past Surgical History:  Procedure Laterality Date  . DILATION AND CURETTAGE OF UTERUS    . HERNIA REPAIR    . TUBAL LIGATION      Prior to  Admission medications   Medication Sig Start Date End Date Taking? Authorizing Provider  calamine lotion Apply 1 application topically as needed for itching. 10/28/18   Karen Kitchens, NP  hydrOXYzine (ATARAX/VISTARIL) 25 MG tablet Take 1 tablet (25 mg total) by mouth 2 (two) times daily as needed. 10/28/18   Karen Kitchens, NP  predniSONE (STERAPRED UNI-PAK 21 TAB) 10 MG (21) TBPK tablet 60 mg x 1 day, 50 mg x 1 day, 40 mg x 1 day, 30 mg x 1 day, 20 mg x 1 day, 10 mg x 1 day. START on 10/29/2018. 10/28/18   Karen Kitchens, NP    Allergies Benadryl [diphenhydramine], Ibuprofen, and Lactose intolerance (gi)  Family History  Problem Relation Age of Onset  . Breast cancer Maternal Aunt   . Hypertension Father   . Ovarian cancer Sister   . Melanoma Maternal Grandmother   . Colon cancer Paternal Grandfather   . Lung cancer Paternal Grandfather   . Thyroid disease Mother     Social History Social History   Tobacco Use  . Smoking status: Former Smoker    Packs/day: 1.00    Years: 16.00    Pack years: 16.00    Types: Cigarettes    Quit date: 05/07/2018    Years since quitting: 1.3  . Smokeless tobacco: Never Used  . Tobacco  comment: started age 64.  currently says 2-3 cigs/day  Substance Use Topics  . Alcohol use: Yes    Comment: rarely  . Drug use: No      Review of Systems Constitutional: No fever/chills Eyes: No visual changes. ENT: No sore throat. Cardiovascular: Positive chest pain Respiratory: Positive shortness of breath Gastrointestinal: No abdominal pain.  No nausea, no vomiting.  No diarrhea.  No constipation. Genitourinary: Negative for dysuria. Musculoskeletal: Negative for back pain. Skin: Negative for rash. Neurological: Positive headache, tingling All other ROS negative ____________________________________________   PHYSICAL EXAM:  VITAL SIGNS: ED Triage Vitals  Enc Vitals Group     BP 09/15/19 1041 131/80     Pulse Rate 09/15/19 1041 74     Resp  09/15/19 1041 16     Temp 09/15/19 1041 98.6 F (37 C)     Temp Source 09/15/19 1041 Oral     SpO2 09/15/19 1041 100 %     Weight 09/15/19 1040 169 lb (76.7 kg)     Height 09/15/19 1040 5\' 4"  (1.626 m)     Head Circumference --      Peak Flow --      Pain Score 09/15/19 1056 6     Pain Loc --      Pain Edu? --      Excl. in [place]? --     Constitutional: Alert and oriented. Well appearing and in no acute distress. Eyes: Conjunctivae are normal. EOMI. Head: Atraumatic. Nose: No congestion/rhinnorhea. Mouth/Throat: Mucous membranes are moist.   Neck: No stridor. Trachea Midline. FROM Cardiovascular: Normal rate, regular rhythm. Grossly normal heart sounds.  Good peripheral circulation.  Maybe some mild chest wall tenderness Respiratory: Normal respiratory effort.  No retractions. Lungs CTAB. Gastrointestinal: Soft and nontender. No distention. No abdominal bruits.  Musculoskeletal: No lower extremity tenderness nor edema.  No joint effusions. Neurologic: Cranial nerves appear intact other than some sensation changes from her elbow down on her left arm.  Equal strength in her arms.  Equal strength in her legs. Skin:  Skin is warm, dry and intact. No rash noted. Psychiatric: Mood and affect are normal. Speech and behavior are normal. GU: Deferred   ____________________________________________   LABS (all labs ordered are listed, but only abnormal results are displayed)  Labs Reviewed  CBC - Abnormal; Notable for the following components:      Result Value   MCH 25.9 (*)    All other components within normal limits  BASIC METABOLIC PANEL  POCT PREGNANCY, URINE  POC URINE PREG, ED  TROPONIN I (HIGH SENSITIVITY)  TROPONIN I (HIGH SENSITIVITY)   ____________________________________________   ED ECG REPORT I, Vanessa Latah, the attending physician, personally viewed and interpreted this ECG.  EKG is normal sinus rate of 71, no ST elevation, no T wave inversions, normal  intervals ____________________________________________  RADIOLOGY Robert Bellow, personally viewed and evaluated these images (plain radiographs) as part of my medical decision making, as well as reviewing the written report by the radiologist.  ED MD interpretation:    Official radiology report(s): DG Chest 2 View  Result Date: 09/15/2019 CLINICAL DATA:  Chest pain. EXAM: CHEST - 2 VIEW COMPARISON:  02/19/2016 FINDINGS: The heart size and mediastinal contours are within normal limits. Both lungs are clear. The visualized skeletal structures are unremarkable. IMPRESSION: No active cardiopulmonary disease. Electronically Signed   By: Kerby Moors M.D.   On: 09/15/2019 11:50    ____________________________________________   PROCEDURES  Procedure(s) performed (  including Critical Care):  Procedures   ____________________________________________   INITIAL IMPRESSION / ASSESSMENT AND PLAN / ED COURSE  Desiree Green was evaluated in Emergency Department on 09/15/2019 for the symptoms described in the history of present illness. She was evaluated in the context of the global COVID-19 pandemic, which necessitated consideration that the patient might be at risk for infection with the SARS-CoV-2 virus that causes COVID-19. Institutional protocols and algorithms that pertain to the evaluation of patients at risk for COVID-19 are in a state of rapid change based on information released by regulatory bodies including the CDC and federal and state organizations. These policies and algorithms were followed during the patient's care in the ED.    Patient is a 34 year old who comes in with headaches and chest pain and some shortness of breath status post [employer] vaccine 1 week ago.  No cranial nerve deficits other than some tingling sensation reported.  Otherwise neuro exam is reassuring.  We discussed CTV to rule out cavernous venous thrombosis, intracranial hemorrhage seems less  likely, stroke seems less likely given patient's age.  Low suspicion for dissection.  Patient is have a history of migraines so could be a complex migraine if CT is negative.    Since we were doing the CT scan of the head we discussed CT PE.  Patient is low risk who is technically PERC negative but given the [employer] vaccine in the report shortness of breath and pain in her chest patient would like to elect to proceed with CT PE to rule out PE  Patient is driving home and does not want any further medications besides Tylenol for her pain   Pregnancy test was negative. No white count elevation to suggest infection.  No evidence of anemia.  Cardiac marker rules out for ACS Chest x-ray without evidence of pneumothorax or pneumonia  Troponin did increase from 2-12 that was over a 4-hour period.  Patient is low risk for ACS I do not think this is related to ACS.  If it has been drawn at the appropriate time I do not expect such a large change.  Patient symptoms initially started at 6 AM so the initial troponin at noon throughout.  CT imaging was negative.  Reevaluated patient.  Patient currently only has tingling from the elbow down on the left arm but is in the median nerve distribution.  This makes me think that this is more peripheral in nature.  She has no other sensation changes on her face or leg at this time.  When she does have some tingling sensation along the left side it is associated with pain in the arm and leg as well as pain in her head.  I discussed with patient that it is unusual for a stroke to cause of pain.  We discussed benefits and cons of MRI.  At this time I have very low suspicion of stroke and patient is never had anything this happen before to suggest MS.  We elected to hold off on MRI at this time but she will return if she develops new neurological symptoms or this is not resolving.  Patient understands that she should report her symptoms to the [facility]ERS system.  She  understands that she should return to the ER if her symptoms are changing or worsening.  Patient feels reassured that the CT imaging was negative.  We discussed symptomatic treatment with Tylenol.  I discussed the provisional nature of ED diagnosis, the treatment so  far, the ongoing plan of care, follow up appointments and return precautions with the patient and any family or support people present. They expressed understanding and agreed with the plan, discharged home.  ____________________________________________   FINAL CLINICAL IMPRESSION(S) / ED DIAGNOSES   Final diagnoses:  Adverse effect of vaccine, initial encounter  Intractable headache, unspecified chronicity pattern, unspecified headache type      MEDICATIONS GIVEN DURING THIS VISIT:  Medications  acetaminophen (TYLENOL) tablet 1,000 mg (1,000 mg Oral Given 09/15/19 1421)  iohexol (OMNIPAQUE) 350 MG/ML injection 75 mL (75 mLs Intravenous Contrast Given 09/15/19 1429)     ED Discharge Orders    None       Note:  This document was prepared using Dragon voice recognition software and may include unintentional dictation errors.   Vanessa Littleton, MD 09/15/19 1600

## 2019-09-15 NOTE — Discharge Instructions (Addendum)
Take Tylenol 1 g every 8 hours.  You can take this with Aleve.  Return to the ER for worsening numbness, weakness, changes in her speech or any other concerns  Please report your vaccine symptoms to the VAERS system  IMPRESSION:  1.  Normal CT appearance of the brain.  2. CT Venogram is negative for dural sinus thrombosis.  IMPRESSION: No acute pulmonary embolus.  Negative CTA of the chest.

## 2019-09-15 NOTE — ED Triage Notes (Addendum)
Patient to ER for c/o headache. States she had Covid-19 vaccine last Wednesday. Patient states she developed "numbness" feeling to same arm that vaccine was in ("feels like when foot goes to sleep"). Then developed headache on Monday to left temple. +Chest pain to left chest. Vaccine was in left arm.

## 2019-12-19 ENCOUNTER — Ambulatory Visit: Payer: Managed Care, Other (non HMO) | Admitting: Podiatry

## 2019-12-19 ENCOUNTER — Encounter: Payer: Self-pay | Admitting: Podiatry

## 2019-12-19 ENCOUNTER — Other Ambulatory Visit: Payer: Self-pay

## 2019-12-19 DIAGNOSIS — B353 Tinea pedis: Secondary | ICD-10-CM | POA: Diagnosis not present

## 2019-12-19 DIAGNOSIS — M7671 Peroneal tendinitis, right leg: Secondary | ICD-10-CM | POA: Diagnosis not present

## 2019-12-19 DIAGNOSIS — L603 Nail dystrophy: Secondary | ICD-10-CM | POA: Diagnosis not present

## 2019-12-19 DIAGNOSIS — L600 Ingrowing nail: Secondary | ICD-10-CM | POA: Diagnosis not present

## 2019-12-19 DIAGNOSIS — C539 Malignant neoplasm of cervix uteri, unspecified: Secondary | ICD-10-CM | POA: Insufficient documentation

## 2019-12-19 MED ORDER — METHYLPREDNISOLONE 4 MG PO TBPK: ORAL_TABLET | ORAL | 0 refills | Status: DC

## 2019-12-19 MED ORDER — NEOMYCIN-POLYMYXIN-HC 1 % OT SOLN: OTIC | 1 refills | Status: DC

## 2019-12-19 NOTE — Patient Instructions (Signed)

## 2019-12-19 NOTE — Progress Notes (Signed)
Subjective:  Patient ID: Desiree Green, female    DOB: 1985/12/07,  MRN: [id number] HPI Chief Complaint  Patient presents with  . Nail Problem    Toenails right - thick, discolored x 1 year, tender at times, skin is peeling and scaly, tried bleach soaks, trimmed nails-no help  . New Patient (Initial Visit)    34 y.o. female presents with the above complaint.   ROS: Denies fever chills nausea vomiting muscle aches and pains.  Past Medical History:  Diagnosis Date  . Cancer ([place])    uterine and cervical  . Colitis   . Migraine    1x/mo   Past Surgical History:  Procedure Laterality Date  . DILATION AND CURETTAGE OF UTERUS    . HERNIA REPAIR    . TUBAL LIGATION      Current Outpatient Medications:  .  methylPREDNISolone (MEDROL DOSEPAK) 4 MG TBPK tablet, 6 day dose pack - take as directed, Disp: 21 tablet, Rfl: 0 .  NEOMYCIN-POLYMYXIN-HYDROCORTISONE (CORTISPORIN) 1 % SOLN OTIC solution, Apply 1-2 drops to toe BID after soaking, Disp: 10 mL, Rfl: 1  Allergies  Allergen Reactions  . Benadryl [Diphenhydramine] Hives  . Ibuprofen Hives  . Lactose Intolerance (Gi) Diarrhea   Review of Systems Objective:  There were no vitals filed for this visit.  General: Well developed, nourished, in no acute distress, alert and oriented x3   Dermatological: Skin is warm, dry and scaly on the right foot with mild tinea pedis and supple on the left foot.  Bilateral. Nails x 10 are well maintained; toenails 1 through 5 of the right foot are thick yellow dystrophic-like mycotic sharp incurvated nail along the fibular margin of the hallux right with mild erythema no cellulitis drainage or odor.  The remainder of the nails on the left foot are normal.  Remaining integument appears unremarkable at this time. There are no open sores, no preulcerative lesions, no rash or signs of infection present.  Vascular: Dorsalis Pedis artery and Posterior Tibial artery pedal pulses are 2/4 bilateral with  immedate capillary fill time. Pedal hair growth present. No varicosities and no lower extremity edema present bilateral.   Neruologic: Grossly intact via light touch bilateral. Vibratory intact via tuning fork bilateral. Protective threshold with Semmes Wienstein monofilament intact to all pedal sites bilateral. Patellar and Achilles deep tendon reflexes 2+ bilateral. No Babinski or clonus noted bilateral.   Musculoskeletal: No gross boney pedal deformities bilateral. No pain, crepitus, or limitation noted with foot and ankle range of motion bilateral. Muscular strength 5/5 in all groups tested bilateral.  She has pain on palpation of the peroneal tendons and pain on abduction against resistance and abduction with plantar flexion against resistance.  Gait: Unassisted, Nonantalgic.    Radiographs:  None taken  Assessment & Plan:   Assessment: Tinea pedis.  Onychomycosis right.  Ingrown nail fibular border hallux right.  Peroneal tendinitis, peroneus longus and peroneus brevis right  Plan: Discussed etiology pathology conservative versus surgical therapies placed her in a Tri-Lock brace on the right side for the peroneal tendinitis and started her on methylprednisolone.  I also performed a chemical matricectomy to the fibular border of the hallux right.  She tolerated procedure well without complications.  She received both oral and written home-going instructions for care and soaking the toe as well as a prescription for Cortisporin Otic to be applied twice daily after soaking.  I also took samples of the toenails to be sent for pathologic evaluation and I will  follow-up with her in 2 weeks to check the matrixectomy site in 1 month for lab results.     [name] T. [place], [place]

## 2020-01-02 ENCOUNTER — Ambulatory Visit: Payer: Managed Care, Other (non HMO) | Admitting: Podiatry

## 2020-01-02 ENCOUNTER — Other Ambulatory Visit: Payer: Self-pay

## 2020-01-02 ENCOUNTER — Encounter: Payer: Self-pay | Admitting: Podiatry

## 2020-01-02 DIAGNOSIS — L03031 Cellulitis of right toe: Secondary | ICD-10-CM

## 2020-01-02 MED ORDER — CELECOXIB 100 MG PO CAPS: 100.0000 mg | ORAL_CAPSULE | Freq: Two times a day (BID) | ORAL | 1 refills | Status: DC

## 2020-01-02 MED ORDER — DOXYCYCLINE HYCLATE 100 MG PO TABS: 100.0000 mg | ORAL_TABLET | Freq: Two times a day (BID) | ORAL | 0 refills | Status: DC

## 2020-01-02 NOTE — Patient Instructions (Signed)

## 2020-01-02 NOTE — Progress Notes (Signed)
She presents today for follow-up of her nail procedure right hallux states that is still little bit sore she refers to the fibular border of the hallux right states that she has been soaking in Betadine and warm water.  States that she still has a little bit of peroneal tendinitis that she has not been wearing her Tri-Lock brace recently.  And we are still awaiting her results from the toenail cultures.  She goes on to say that she did not get the steroid prescription filled because her insurance did not want to pay for it.  Objective: Vital signs are stable alert and oriented x3.  Pulses are palpable she still has some edema and fluctuance beneath the lateral malleolus and just posterior superior to the lateral malleolus consistent with peroneal tendinitis.  The toe does demonstrate some mild erythema of the proximal nail fold area there is a superficial ulceration there is most likely secondary to the chemical destruction of the matrix.  I do not see any signs of infection currently though I would like to consider an antibiotic.  There is no purulence no malodor.  Assessment: Well-healing surgical toe with mild erythema proximal lateral border.  Mild superficial ulcerative lesion.  Peroneal tendinitis right.  Peroneal tear possibly.  Nail dystrophies.  Plan: Discussed etiology pathology conservative versus surgical therapies at this point but a prescription for doxycycline recommended she discontinue Betadine start with Epson salts and warm water soaks cover during the day leave open at bedtime and continue the use of the Cortisporin otic.  Also wrote another prescription for Celebrex 100 mg 1 p.o. twice daily to help with the peroneal tendinitis encouraged her to continue to use her Tri-Lock brace.  We will follow-up with her in about 2 weeks not only to check the nail progress but also to reevaluate the paronychia is present.

## 2020-01-16 ENCOUNTER — Ambulatory Visit: Payer: Managed Care, Other (non HMO) | Admitting: Podiatry

## 2020-01-18 ENCOUNTER — Other Ambulatory Visit: Payer: Self-pay

## 2020-01-18 ENCOUNTER — Encounter: Payer: Self-pay | Admitting: Podiatry

## 2020-01-18 ENCOUNTER — Ambulatory Visit: Payer: Managed Care, Other (non HMO) | Admitting: Podiatry

## 2020-01-18 DIAGNOSIS — L603 Nail dystrophy: Secondary | ICD-10-CM | POA: Diagnosis not present

## 2020-01-18 DIAGNOSIS — S86311A Strain of muscle(s) and tendon(s) of peroneal muscle group at lower leg level, right leg, initial encounter: Secondary | ICD-10-CM

## 2020-01-18 NOTE — Progress Notes (Signed)
She presents today for follow-up of her pathology report.  Objective: Pathology report is positive for dermatophytes.  Assessment: Onychomycosis.  Plan: At this point she would like to try laser therapy.  We will get her scheduled at her [facility] office for laser therapy.

## 2020-02-24 ENCOUNTER — Ambulatory Visit (INDEPENDENT_AMBULATORY_CARE_PROVIDER_SITE_OTHER): Payer: Self-pay | Admitting: *Deleted

## 2020-02-24 ENCOUNTER — Other Ambulatory Visit: Payer: Self-pay

## 2020-02-24 DIAGNOSIS — L603 Nail dystrophy: Secondary | ICD-10-CM

## 2020-02-24 DIAGNOSIS — B351 Tinea unguium: Secondary | ICD-10-CM

## 2020-02-24 NOTE — Progress Notes (Signed)
Patient presents today for the 1st laser treatment. Diagnosed with mycotic nail infection by Dr. Milinda Pointer.   Toenail most affected are 1-5 right. She's had a recent ingrown procedure on the lateral border of the right hallux.  All other systems are negative.  Nails were filed thin. Laser therapy was administered to 1-5 toenails right and patient tolerated the treatment well. All safety precautions were in place.    Follow up in 4 weeks for laser # 2.  Picture of nails taken today to document visual progress

## 2020-02-24 NOTE — Patient Instructions (Signed)

## 2020-03-19 ENCOUNTER — Other Ambulatory Visit: Payer: Self-pay

## 2020-03-19 ENCOUNTER — Ambulatory Visit
Admission: RE | Admit: 2020-03-19 | Discharge: 2020-03-19 | Disposition: A | Discharge status: Home / Self Care | Payer: Managed Care, Other (non HMO) | Source: Ambulatory Visit

## 2020-03-19 VITALS — BP 118/78 | HR 80 | Temp 98.9°F | Resp 17

## 2020-03-19 DIAGNOSIS — J069 Acute upper respiratory infection, unspecified: Secondary | ICD-10-CM | POA: Diagnosis not present

## 2020-03-19 NOTE — Discharge Instructions (Addendum)
Your COVID test is pending.  You should self quarantine until the test result is back.    Take Tylenol as needed for fever or discomfort.  Rest and keep yourself hydrated.    Go to the emergency department if you develop acute worsening symptoms.     

## 2020-03-19 NOTE — ED Triage Notes (Signed)
Patient reports headache started 2-3 days ago, yesterday developed nasal drainage, sore throat and fever.

## 2020-03-19 NOTE — ED Provider Notes (Signed)
[name]    CSN: [id number] Arrival date & time: 03/19/20  1249      History   Chief Complaint Chief Complaint  Patient presents with   Headache   Nasal Congestion   Sore Throat   Fever    HPI Desiree Green is a 34 y.o. female.   Patient presents with headache, fever, postnasal drip, sinus congestion, sore throat, nonproductive cough x3 days.  No treatments attempted at home.  She denies rash, shortness of breath, vomiting, diarrhea, or other symptoms.  Her medical history includes cervical cancer, migraines, colitis, low back pain, umbilical hernia.  The history is provided by the patient and medical records.    Past Medical History:  Diagnosis Date   Cancer [facility])    uterine and cervical   Colitis    Migraine    1x/mo    Patient Active Problem List   Diagnosis Date Noted   Cervical cancer ([place]) 12/19/2019   Metrorrhagia 02/24/2018   Overweight (BMI 25.0-29.9) 02/24/2018   Headache 54/00/8676   Umbilical hernia 19/50/9326   Disorder of sacrum 04/21/2012   Low back pain 04/21/2012    Past Surgical History:  Procedure Laterality Date   DILATION AND CURETTAGE OF UTERUS     HERNIA REPAIR     TUBAL LIGATION      OB History    Gravida  4   Para  2   Term  2   Preterm      AB  2   Living  2     SAB  1   TAB      Ectopic  1   Multiple      Live Births               Home Medications    Prior to Admission medications   Medication Sig Start Date End Date Taking? Authorizing Provider  fexofenadine (ALLEGRA) 60 MG tablet Take 60 mg by mouth 2 (two) times daily.   Yes [provider]  celecoxib (CELEBREX) 100 MG capsule Take 1 capsule (100 mg total) by mouth 2 (two) times daily. 01/02/20   Hyatt, Max T, DPM  NEOMYCIN-POLYMYXIN-HYDROCORTISONE (CORTISPORIN) 1 % SOLN OTIC solution Apply 1-2 drops to toe BID after soaking 12/19/19   Hyatt, Max T, DPM    Family History Family History  Problem Relation  Age of Onset   Breast cancer Maternal Aunt    Hypertension Father    Ovarian cancer Sister    Melanoma Maternal Grandmother    Colon cancer Paternal Grandfather    Lung cancer Paternal Grandfather    Thyroid disease Mother     Social History Social History   Tobacco Use   Smoking status: Former Smoker    Packs/day: 1.00    Years: 16.00    Pack years: 16.00    Types: Cigarettes    Quit date: 05/07/2018    Years since quitting: 1.8   Smokeless tobacco: Never Used   Tobacco comment: started age 14.  currently says 2-3 cigs/day  Vaping Use   Vaping Use: Never used  Substance Use Topics   Alcohol use: Yes    Comment: rarely   Drug use: No     Allergies   Benadryl [diphenhydramine], Ibuprofen, and Lactose intolerance (gi)   Review of Systems Review of Systems  Constitutional: Positive for fever. Negative for chills.  HENT: Positive for congestion, postnasal drip and sore throat. Negative for ear pain.   Eyes: Negative for pain  and visual disturbance.  Respiratory: Positive for cough. Negative for shortness of breath.   Cardiovascular: Negative for chest pain and palpitations.  Gastrointestinal: Negative for abdominal pain, diarrhea and vomiting.  Genitourinary: Negative for dysuria and hematuria.  Musculoskeletal: Negative for arthralgias and back pain.  Skin: Negative for color change and rash.  Neurological: Positive for headaches. Negative for seizures and syncope.  All other systems reviewed and are negative.    Physical Exam Triage Vital Signs ED Triage Vitals  Enc Vitals Group     BP 03/19/20 1330 118/78     Pulse Rate 03/19/20 1330 80     Resp 03/19/20 1330 17     Temp 03/19/20 1330 98.9 F (37.2 C)     Temp Source 03/19/20 1330 Oral     SpO2 03/19/20 1330 98 %     Weight --      Height --      Head Circumference --      Peak Flow --      Pain Score 03/19/20 1328 9     Pain Loc --      Pain Edu? --      Excl. in [place]? --    No data  found.  Updated Vital Signs BP 118/78 (BP Location: Left Arm)    Pulse 80    Temp 98.9 F (37.2 C) (Oral)    Resp 17    SpO2 98%   Visual Acuity Right Eye Distance:   Left Eye Distance:   Bilateral Distance:    Right Eye Near:   Left Eye Near:    Bilateral Near:     Physical Exam Vitals and nursing note reviewed.  Constitutional:      General: She is not in acute distress.    Appearance: She is well-developed. She is not ill-appearing.  HENT:     Head: Normocephalic and atraumatic.     Right Ear: Tympanic membrane normal.     Left Ear: Tympanic membrane normal.     Nose: Nose normal.     Mouth/Throat:     Mouth: Mucous membranes are moist.     Pharynx: Oropharynx is clear.  Eyes:     Conjunctiva/sclera: Conjunctivae normal.  Cardiovascular:     Rate and Rhythm: Normal rate and regular rhythm.     Heart sounds: No murmur heard.   Pulmonary:     Effort: Pulmonary effort is normal. No respiratory distress.     Breath sounds: Normal breath sounds.  Abdominal:     Palpations: Abdomen is soft.     Tenderness: There is no abdominal tenderness. There is no guarding or rebound.  Musculoskeletal:     Cervical back: Neck supple.  Skin:    General: Skin is warm and dry.     Findings: No rash.  Neurological:     General: No focal deficit present.     Mental Status: She is alert and oriented to person, place, and time.     Gait: Gait normal.  Psychiatric:        Mood and Affect: Mood normal.        Behavior: Behavior normal.      UC Treatments / Results  Labs (all labs ordered are listed, but only abnormal results are displayed) Labs Reviewed  NOVEL CORONAVIRUS, NAA    EKG   Radiology No results found.  Procedures Procedures (including critical care time)  Medications Ordered in UC Medications - No data to display  Initial Impression / Assessment and  Plan / UC Course  I have reviewed the triage vital signs and the nursing notes.  Pertinent labs &  imaging results that were available during my care of the patient were reviewed by me and considered in my medical decision making (see chart for details).   Viral URI.  PCR COVID pending.  Instructed patient to self quarantine until the test result is back.  Discussed symptomatic treatment including Tylenol, rest, hydration.  Instructed patient to go to the ED if she has acute worsening symptoms.  Patient agrees to plan of care.   Final Clinical Impressions(s) / UC Diagnoses   Final diagnoses:  Viral URI     Discharge Instructions     Your COVID test is pending.  You should self quarantine until the test result is back.    Take Tylenol as needed for fever or discomfort.  Rest and keep yourself hydrated.    Go to the emergency department if you develop acute worsening symptoms.        ED Prescriptions    None     PDMP not reviewed this encounter.   Sharion Balloon, NP 03/19/20 1351

## 2020-03-20 LAB — NOVEL CORONAVIRUS, NAA: SARS-CoV-2, NAA: NOT DETECTED

## 2020-03-20 LAB — SARS-COV-2, NAA 2 DAY TAT

## 2020-03-22 ENCOUNTER — Other Ambulatory Visit: Payer: Self-pay

## 2020-03-22 ENCOUNTER — Ambulatory Visit (INDEPENDENT_AMBULATORY_CARE_PROVIDER_SITE_OTHER): Payer: Managed Care, Other (non HMO) | Admitting: *Deleted

## 2020-03-22 DIAGNOSIS — L603 Nail dystrophy: Secondary | ICD-10-CM

## 2020-03-22 NOTE — Progress Notes (Signed)
Patient presents today for the 2nd laser treatment. Diagnosed with mycotic nail infection by Dr. Milinda Pointer.   Toenail most affected are 1-5 right. She's had a recent ingrown procedure on the lateral border of the right hallux.  All other systems are negative.  Nails were filed thin. Laser therapy was administered to 1-5 toenails right and patient tolerated the treatment well. All safety precautions were in place.    Follow up in 4 weeks for laser # 3.

## 2020-03-23 ENCOUNTER — Other Ambulatory Visit: Payer: Self-pay

## 2020-04-16 ENCOUNTER — Ambulatory Visit
Admission: RE | Admit: 2020-04-16 | Discharge: 2020-04-16 | Disposition: A | Discharge status: Home / Self Care | Payer: Managed Care, Other (non HMO) | Source: Ambulatory Visit | Attending: Family Medicine | Admitting: Family Medicine

## 2020-04-16 ENCOUNTER — Other Ambulatory Visit: Payer: Self-pay

## 2020-04-16 VITALS — BP 111/84 | HR 70 | Temp 98.7°F | Resp 18 | Ht 65.0 in | Wt 185.0 lb

## 2020-04-16 DIAGNOSIS — Z79899 Other long term (current) drug therapy: Secondary | ICD-10-CM | POA: Diagnosis not present

## 2020-04-16 DIAGNOSIS — J069 Acute upper respiratory infection, unspecified: Secondary | ICD-10-CM

## 2020-04-16 DIAGNOSIS — R0981 Nasal congestion: Secondary | ICD-10-CM | POA: Diagnosis present

## 2020-04-16 DIAGNOSIS — Z886 Allergy status to analgesic agent status: Secondary | ICD-10-CM | POA: Insufficient documentation

## 2020-04-16 DIAGNOSIS — Z20822 Contact with and (suspected) exposure to covid-19: Secondary | ICD-10-CM | POA: Insufficient documentation

## 2020-04-16 DIAGNOSIS — Z888 Allergy status to other drugs, medicaments and biological substances status: Secondary | ICD-10-CM | POA: Diagnosis not present

## 2020-04-16 DIAGNOSIS — R059 Cough, unspecified: Secondary | ICD-10-CM | POA: Diagnosis not present

## 2020-04-16 DIAGNOSIS — Z87891 Personal history of nicotine dependence: Secondary | ICD-10-CM | POA: Diagnosis not present

## 2020-04-16 MED ORDER — BENZONATATE 100 MG PO CAPS: 200.0000 mg | ORAL_CAPSULE | Freq: Three times a day (TID) | ORAL | 0 refills | Status: DC

## 2020-04-16 MED ORDER — PROMETHAZINE-DM 6.25-15 MG/5ML PO SYRP: 5.0000 mL | ORAL_SOLUTION | Freq: Four times a day (QID) | ORAL | 0 refills | Status: DC | PRN

## 2020-04-16 NOTE — ED Triage Notes (Signed)
Pt presents to Urgent Care with c/o nasal congestion, sore throat, headache, and fever x 3 days. Also began coughing yesterday.  Pt w/ no known COVID exposure; she has been vaccinated.

## 2020-04-16 NOTE — Discharge Instructions (Addendum)
Isolate at home until the results of your Covid test are back.  If you are positive you will need to quarantine for 10 days from the start of your symptoms.  After 10 days you can break quarantine if your symptoms have improved and you have not run a fever for 24 hours.  If your test is positive we will refer you to the infusion clinic for monoclonal antibody infusion.  Use the Tessalon Perles during the day for cough and the Promethazine DM at bedtime.  Perform sinus irrigation with a NeilMed sinus rinse kit and warm distilled water twice daily.  Return for new or worsening symptoms.

## 2020-04-16 NOTE — ED Provider Notes (Signed)
[facility]    CSN: [id number] Arrival date & time: 04/16/20  1758      History   Chief Complaint Chief Complaint  Patient presents with   Nasal Congestion   Headache   Fever    HPI Desiree Green is a 34 y.o. female.   34 year old female here for evaluation of nasal congestion, sore throat, headache, fever, and cough.  Patient reports that her symptoms started 3 days ago.  She is also dealing with a lot of fatigue and sinus pressure.  Her highest fever was 100.2.  She is also had some ear pressure.  Patient denies being around anyone else with similar symptoms.  No shortness of breath, wheezing, nausea, vomiting, diarrhea, body aches, changes to taste or smell, or postnasal drip.     Past Medical History:  Diagnosis Date   Cancer [facility])    uterine and cervical   Colitis    Migraine    1x/mo    Patient Active Problem List   Diagnosis Date Noted   Cervical cancer ([place]) 12/19/2019   Metrorrhagia 02/24/2018   Overweight (BMI 25.0-29.9) 02/24/2018   Headache 62/83/1517   Umbilical hernia 61/60/7371   Disorder of sacrum 04/21/2012   Low back pain 04/21/2012    Past Surgical History:  Procedure Laterality Date   DILATION AND CURETTAGE OF UTERUS     HERNIA REPAIR     TUBAL LIGATION      OB History    Gravida  4   Para  2   Term  2   Preterm      AB  2   Living  2     SAB  1   TAB      Ectopic  1   Multiple      Live Births               Home Medications    Prior to Admission medications   Medication Sig Start Date End Date Taking? Authorizing Provider  fexofenadine (ALLEGRA) 60 MG tablet Take 60 mg by mouth 2 (two) times daily.   Yes [provider]  naproxen sodium (ALEVE) 220 MG tablet Take 220 mg by mouth daily as needed.   Yes [provider]  benzonatate (TESSALON) 100 MG capsule Take 2 capsules (200 mg total) by mouth every 8 (eight) hours. 04/16/20   Margarette Canada, NP  celecoxib  (CELEBREX) 100 MG capsule Take 1 capsule (100 mg total) by mouth 2 (two) times daily. 01/02/20   Hyatt, Max T, DPM  NEOMYCIN-POLYMYXIN-HYDROCORTISONE (CORTISPORIN) 1 % SOLN OTIC solution Apply 1-2 drops to toe BID after soaking 12/19/19   Hyatt, Max T, DPM  promethazine-dextromethorphan (PROMETHAZINE-DM) 6.25-15 MG/5ML syrup Take 5 mLs by mouth 4 (four) times daily as needed. 04/16/20   Margarette Canada, NP    Family History Family History  Problem Relation Age of Onset   Breast cancer Maternal Aunt    Hypertension Father    Ovarian cancer Sister    Melanoma Maternal Grandmother    Colon cancer Paternal Grandfather    Lung cancer Paternal Grandfather    Thyroid disease Mother     Social History Social History   Tobacco Use   Smoking status: Former Smoker    Packs/day: 1.00    Years: 16.00    Pack years: 16.00    Types: Cigarettes    Quit date: 05/07/2018    Years since quitting: 1.9   Smokeless tobacco: Never Used   Tobacco  comment: started age 15.  currently says 2-3 cigs/day  Vaping Use   Vaping Use: Never used  Substance Use Topics   Alcohol use: Yes    Comment: rarely   Drug use: No     Allergies   Benadryl [diphenhydramine], Ibuprofen, and Lactose intolerance (gi)   Review of Systems Review of Systems  Constitutional: Positive for fever. Negative for activity change and appetite change.  HENT: Positive for congestion, ear pain, rhinorrhea, sinus pressure and sore throat. Negative for ear discharge and postnasal drip.   Respiratory: Positive for cough. Negative for shortness of breath and wheezing.   Cardiovascular: Negative for chest pain.  Gastrointestinal: Negative for diarrhea, nausea and vomiting.  Musculoskeletal: Negative for arthralgias and myalgias.  Skin: Negative for rash.  Neurological: Positive for headaches. Negative for syncope.  Hematological: Negative.   Psychiatric/Behavioral: Negative.      Physical Exam Triage Vital Signs ED  Triage Vitals  Enc Vitals Group     BP 04/16/20 1847 111/84     Pulse Rate 04/16/20 1847 70     Resp 04/16/20 1847 18     Temp 04/16/20 1847 98.7 F (37.1 C)     Temp Source 04/16/20 1847 Oral     SpO2 04/16/20 1847 100 %     Weight 04/16/20 1841 185 lb (83.9 kg)     Height 04/16/20 1841 5' 5"  (1.651 m)     Head Circumference --      Peak Flow --      Pain Score 04/16/20 1841 8     Pain Loc --      Pain Edu? --      Excl. in [place]? --    No data found.  Updated Vital Signs BP 111/84    Pulse 70    Temp 98.7 F (37.1 C) (Oral)    Resp 18    Ht 5' 5"  (1.651 m)    Wt 185 lb (83.9 kg)    LMP 04/09/2020    SpO2 100%    BMI 30.79 kg/m   Visual Acuity Right Eye Distance:   Left Eye Distance:   Bilateral Distance:    Right Eye Near:   Left Eye Near:    Bilateral Near:     Physical Exam Vitals and nursing note reviewed.  Constitutional:      General: She is not in acute distress.    Appearance: She is well-developed and normal weight. She is not toxic-appearing.  HENT:     Head: Normocephalic and atraumatic.     Comments: Nasal mucosa is mildly erythematous and edematous with clear nasal discharge.    Mouth/Throat:     Mouth: Mucous membranes are moist.     Pharynx: Oropharynx is clear.     Comments: Posterior oropharynx has mild erythema with some sanguinous postnasal drip. Eyes:     General: No scleral icterus.    Extraocular Movements: Extraocular movements intact.     Pupils: Pupils are equal, round, and reactive to light.  Cardiovascular:     Rate and Rhythm: Normal rate and regular rhythm.     Heart sounds: Normal heart sounds. No murmur heard.  No gallop.   Pulmonary:     Effort: Pulmonary effort is normal.     Breath sounds: Normal breath sounds. No wheezing, rhonchi or rales.  Musculoskeletal:        General: No swelling. Normal range of motion.     Cervical back: Normal range of motion and neck supple.  Lymphadenopathy:     Cervical: No cervical adenopathy.    Skin:    General: Skin is warm and dry.     Capillary Refill: Capillary refill takes less than 2 seconds.     Findings: No erythema or rash.  Neurological:     Mental Status: She is alert and oriented to person, place, and time.     GCS: GCS eye subscore is 4. GCS motor subscore is 6.      UC Treatments / Results  Labs (all labs ordered are listed, but only abnormal results are displayed) Labs Reviewed  SARS CORONAVIRUS 2 (TAT 6-24 HRS)    EKG   Radiology No results found.  Procedures Procedures (including critical care time)  Medications Ordered in UC Medications - No data to display  Initial Impression / Assessment and Plan / UC Course  I have reviewed the triage vital signs and the nursing notes.  Pertinent labs & imaging results that were available during my care of the patient were reviewed by me and considered in my medical decision making (see chart for details).   Patient is here for evaluation of nasal congestion, sore throat, headache, fever, and cough x3 days.  Patient is also had some fatigue and sinus pressure.  Patient's physical exam reveals mildly erythematous and edematous nasal mucosa with clear nasal discharge.  Patient has some clear mixed with bloody postnasal drip.  Pansinus tenderness to percussion on exam.  No cervical lymphadenopathy appreciated.  Lung sounds clear to auscultation in all fields.  Symptoms are consistent with viral URI.  Will send Covid swab.   Final Clinical Impressions(s) / UC Diagnoses   Final diagnoses:  Viral URI with cough     Discharge Instructions     Isolate at home until the results of your Covid test are back.  If you are positive you will need to quarantine for 10 days from the start of your symptoms.  After 10 days you can break quarantine if your symptoms have improved and you have not run a fever for 24 hours.  If your test is positive we will refer you to the infusion clinic for monoclonal antibody  infusion.  Use the Tessalon Perles during the day for cough and the Promethazine DM at bedtime.  Perform sinus irrigation with a NeilMed sinus rinse kit and warm distilled water twice daily.  Return for new or worsening symptoms.    ED Prescriptions    Medication Sig Dispense Auth. Provider   benzonatate (TESSALON) 100 MG capsule Take 2 capsules (200 mg total) by mouth every 8 (eight) hours. 21 capsule Margarette Canada, NP   promethazine-dextromethorphan (PROMETHAZINE-DM) 6.25-15 MG/5ML syrup Take 5 mLs by mouth 4 (four) times daily as needed. 118 mL Margarette Canada, NP     PDMP not reviewed this encounter.   Margarette Canada, NP 04/16/20 Einar Crow

## 2020-04-17 LAB — SARS CORONAVIRUS 2 (TAT 6-24 HRS): SARS Coronavirus 2: NEGATIVE

## 2020-04-20 ENCOUNTER — Other Ambulatory Visit: Payer: Managed Care, Other (non HMO)

## 2020-05-29 ENCOUNTER — Ambulatory Visit
Admission: EM | Admit: 2020-05-29 | Discharge: 2020-05-29 | Disposition: A | Discharge status: Home / Self Care | Payer: Managed Care, Other (non HMO)

## 2020-05-29 ENCOUNTER — Other Ambulatory Visit: Payer: Self-pay

## 2020-05-29 DIAGNOSIS — B349 Viral infection, unspecified: Secondary | ICD-10-CM | POA: Diagnosis not present

## 2020-05-29 NOTE — Discharge Instructions (Addendum)
This is most likely viral.  You can take over-the-counter medicines as needed. Covid and flu swab pending Follow up as needed for continued or worsening symptoms

## 2020-05-29 NOTE — ED Triage Notes (Signed)
Cough, body aches, fever 99.8-100.7 since last night, HA, chills since last night.  Exposed to COVID 4 days ago.   Took Aleve just PTA.

## 2020-05-29 NOTE — ED Notes (Signed)
Called pt at 12:07 pm no answer

## 2020-05-30 NOTE — ED Provider Notes (Signed)
[name]    CSN: [id number] Arrival date & time: 05/29/20  1026      History   Chief Complaint No chief complaint on file.   HPI Desiree Green is a 34 y.o. female.   Patient is a 34 year old female presents today with cough, body aches, fever since last night.  She is also had some headache, chills since last night.  Exposed to Covid 4 days ago.  Took Aleve prior to arrival.  No chest pain or shortness of breath.     Past Medical History:  Diagnosis Date   Cancer [facility])    uterine and cervical   Colitis    Migraine    1x/mo    Patient Active Problem List   Diagnosis Date Noted   Cervical cancer ([place]) 12/19/2019   Metrorrhagia 02/24/2018   Overweight (BMI 25.0-29.9) 02/24/2018   Headache 123456   Umbilical hernia 123456   Disorder of sacrum 04/21/2012   Low back pain 04/21/2012    Past Surgical History:  Procedure Laterality Date   DILATION AND CURETTAGE OF UTERUS     HERNIA REPAIR     TUBAL LIGATION      OB History    Gravida  4   Para  2   Term  2   Preterm      AB  2   Living  2     SAB  1   IAB      Ectopic  1   Multiple      Live Births               Home Medications    Prior to Admission medications   Medication Sig Start Date End Date Taking? Authorizing Provider  fexofenadine (ALLEGRA) 60 MG tablet Take 60 mg by mouth 2 (two) times daily.   Yes [provider]  rizatriptan (MAXALT) 10 MG tablet Take 10 mg by mouth as needed for migraine. May repeat in 2 hours if needed   Yes [provider]  naproxen sodium (ALEVE) 220 MG tablet Take 220 mg by mouth daily as needed.    [provider]    Family History Family History  Problem Relation Age of Onset   Breast cancer Maternal Aunt    Hypertension Father    Ovarian cancer Sister    Melanoma Maternal Grandmother    Colon cancer Paternal Grandfather    Lung cancer Paternal Grandfather    Thyroid  disease Mother     Social History Social History   Tobacco Use   Smoking status: Former Smoker    Packs/day: 1.00    Years: 16.00    Pack years: 16.00    Types: Cigarettes    Quit date: 05/07/2018    Years since quitting: 2.0   Smokeless tobacco: Never Used   Tobacco comment: started age 17.  currently says 2-3 cigs/day  Vaping Use   Vaping Use: Never used  Substance Use Topics   Alcohol use: Yes    Comment: rarely   Drug use: No     Allergies   Benadryl [diphenhydramine], Ibuprofen, and Lactose intolerance (gi)   Review of Systems Review of Systems   Physical Exam Triage Vital Signs ED Triage Vitals [05/29/20 1213]  Enc Vitals Group     BP      Pulse      Resp      Temp      Temp src      SpO2  Weight      Height      Head Circumference      Peak Flow      Pain Score 9     Pain Loc      Pain Edu?      Excl. in [place]?    No data found.  Updated Vital Signs LMP 05/06/2020   Visual Acuity Right Eye Distance:   Left Eye Distance:   Bilateral Distance:    Right Eye Near:   Left Eye Near:    Bilateral Near:     Physical Exam Vitals and nursing note reviewed.  Constitutional:      General: She is not in acute distress.    Appearance: Normal appearance. She is not ill-appearing, toxic-appearing or diaphoretic.  HENT:     Head: Normocephalic.     Right Ear: Tympanic membrane and ear canal normal.     Left Ear: Tympanic membrane and ear canal normal.     Nose: Nose normal.     Mouth/Throat:     Pharynx: Oropharynx is clear.  Eyes:     Conjunctiva/sclera: Conjunctivae normal.  Cardiovascular:     Rate and Rhythm: Normal rate and regular rhythm.  Pulmonary:     Effort: Pulmonary effort is normal.     Breath sounds: Normal breath sounds.  Musculoskeletal:        General: Normal range of motion.     Cervical back: Normal range of motion.  Skin:    General: Skin is warm and dry.     Findings: No rash.  Neurological:     Mental  Status: She is alert.  Psychiatric:        Mood and Affect: Mood normal.      UC Treatments / Results  Labs (all labs ordered are listed, but only abnormal results are displayed) Labs Reviewed  COVID-19, FLU A+B NAA    EKG   Radiology No results found.  Procedures Procedures (including critical care time)  Medications Ordered in UC Medications - No data to display  Initial Impression / Assessment and Plan / UC Course  I have reviewed the triage vital signs and the nursing notes.  Pertinent labs & imaging results that were available during my care of the patient were reviewed by me and considered in my medical decision making (see chart for details).     Viral illness Nothing concerning on exam.  Covid and flu swab pending. Over-the-counter medicines as needed Follow up as needed for continued or worsening symptoms  Final Clinical Impressions(s) / UC Diagnoses   Final diagnoses:  Viral illness     Discharge Instructions     This is most likely viral.  You can take over-the-counter medicines as needed. Covid and flu swab pending Follow up as needed for continued or worsening symptoms     ED Prescriptions    None     PDMP not reviewed this encounter.   Janace Aris, NP 05/30/20 1007

## 2020-06-01 LAB — COVID-19, FLU A+B NAA
Influenza A, NAA: NOT DETECTED
Influenza B, NAA: NOT DETECTED
SARS-CoV-2, NAA: NOT DETECTED

## 2020-09-16 IMAGING — CT CT VENOGRAM HEAD
1 of 9 series · 15 of 47 positions shown · IV contrast (omnipaque)
Comparison: No prior head imaging.

CLINICAL DATA: 33-year-old female with headache, left arm numbness,
chest pain. One week status post 2OULM-LE vaccine.

EXAM:
CT VENOGRAM HEAD
TECHNIQUE: Multi detector CT of the head performed 666 prior to and following
bolus administration of IV contrast. Multiplanar reformatted images
provided.
CONTRAST:  75mL OMNIPAQUE IOHEXOL 350 MG/ML SOLN

[Series 7: head w · axial · 0.43mm/px · z∈[-174,-52]mm · 15 of 71 slices shown]
[im 5/71  brain]
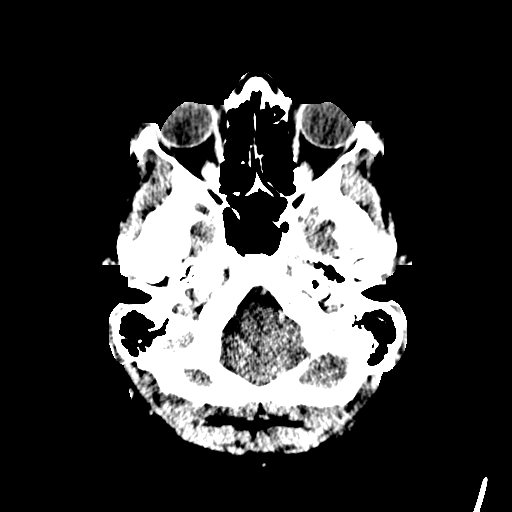
[im 9/71  bone]
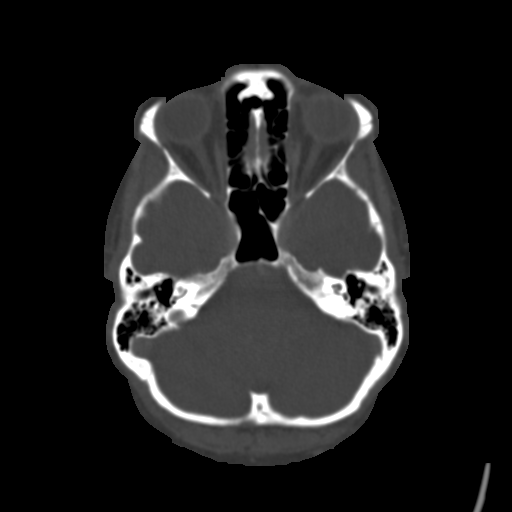
[im 14/71  brain]
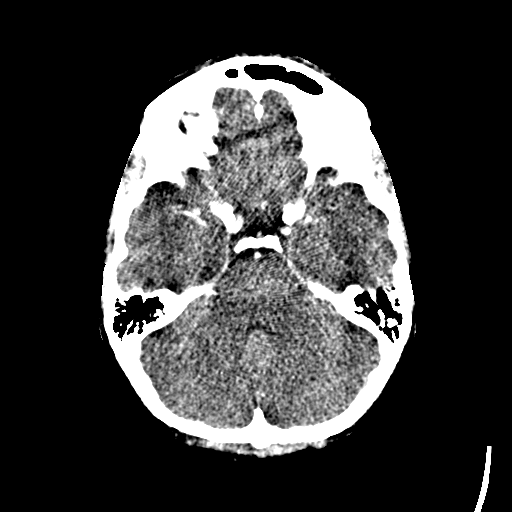
[im 18/71  bone]
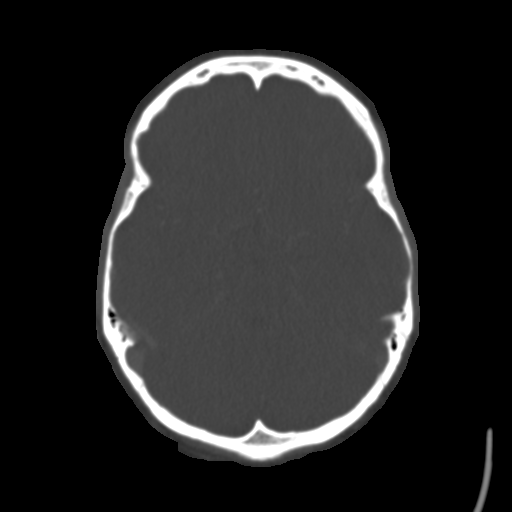
[im 22/71  brain]
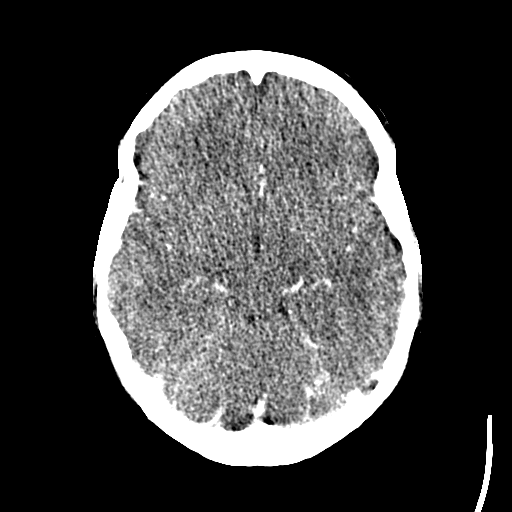
[im 27/71  bone]
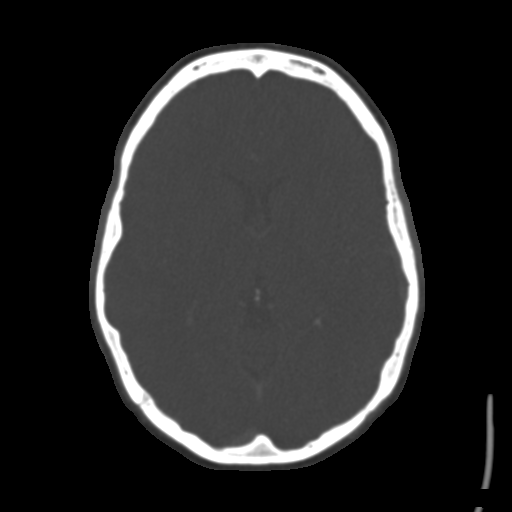
[im 31/71  brain]
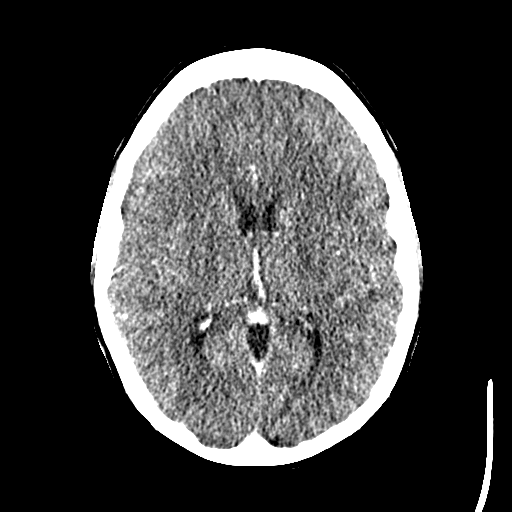
[im 36/71  bone]
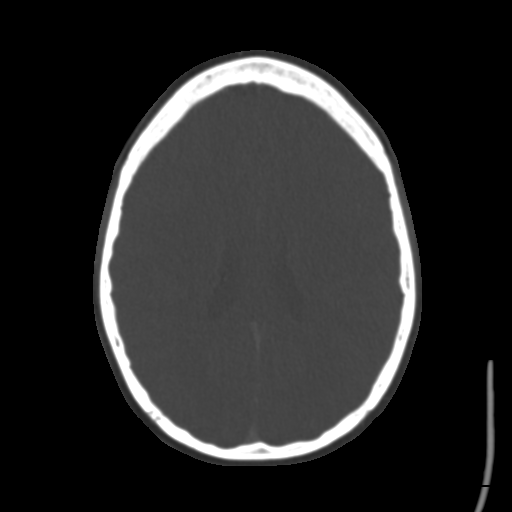
[im 40/71  brain]
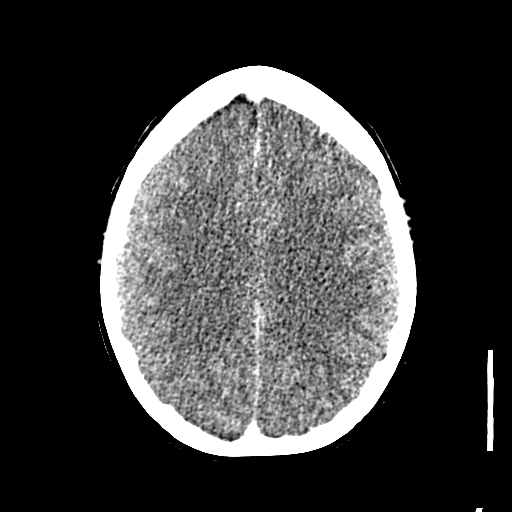
[im 44/71  bone]
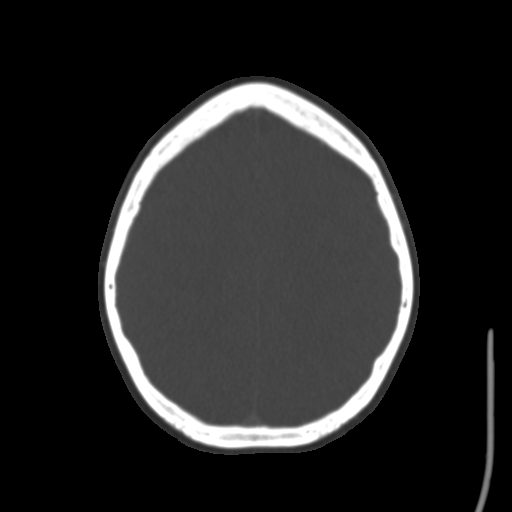
[im 49/71  brain]
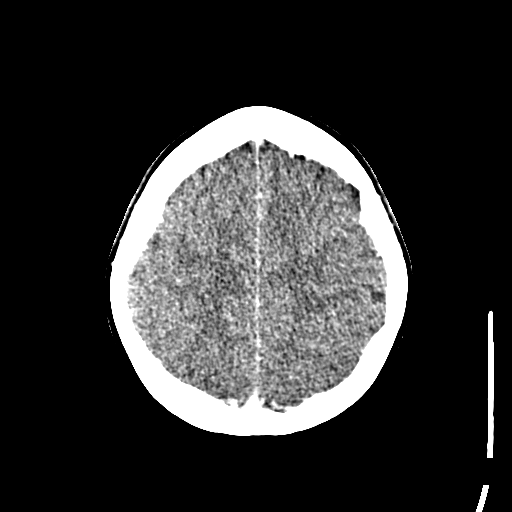
[im 53/71  bone]
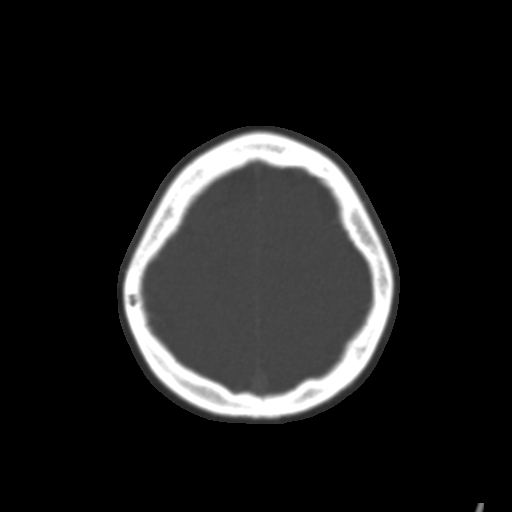
[im 57/71  brain]
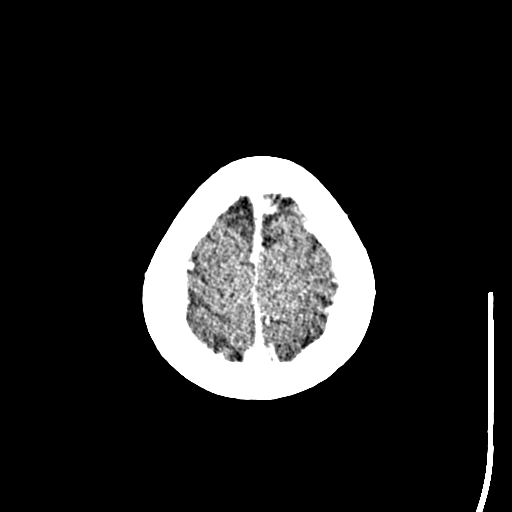
[im 62/71  bone]
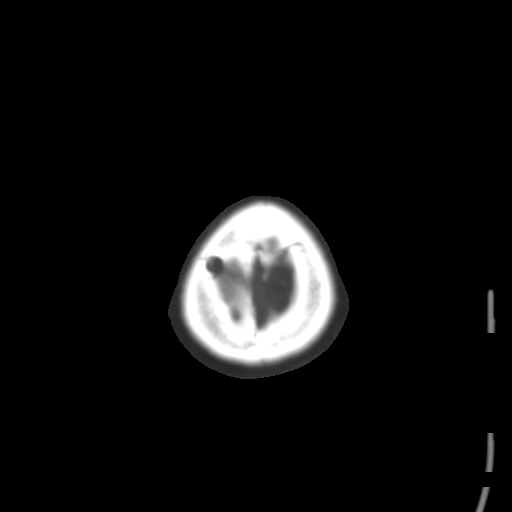
[im 66/71  brain]
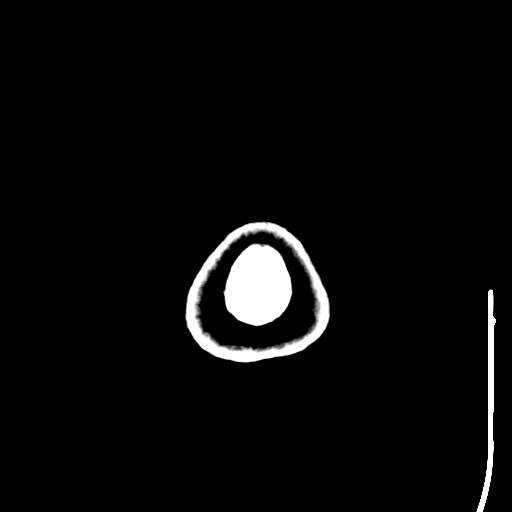

[15 of 47 positions shown; findings below may reference images not displayed]

FINDINGS: CT HEAD

Brain: Normal cerebral volume. No midline shift, ventriculomegaly,
mass effect, evidence of mass lesion, intracranial hemorrhage or
evidence of cortically based acute infarction. Gray-white matter
differentiation is within normal limits throughout the brain.

Following contrast no abnormal intracranial enhancement is evident.

Calvarium and skull base: No osseous abnormality identified.

Paranasal sinuses: Visualized paranasal sinuses and mastoids are
clear.

Orbits: Visualized orbit soft tissues are within normal limits.
Visualized scalp soft tissues are within normal limits.

CTV:

Venous sinuses: The superior sagittal sinus, torcula, straight
sinus, vein of Yu and internal cerebral veins are patent and
normally enhancing. The bilateral transverse and sigmoid sinuses are
enhancing and patent. There is an arachnoid granulation at the
junction of the left transverse and sigmoid sinus, normal variant
series 7, image 22. Both IJ bulbs appear patent. Cavernous sinus
enhancement also appears normal.

Anatomic variants: Mildly dominant right transverse and sigmoid
sinuses.

Review of the MIP images confirms the above findings
IMPRESSION: 1.  Normal CT appearance of the brain.
2. CT Venogram is negative for dural sinus thrombosis.

## 2021-04-10 DIAGNOSIS — L237 Allergic contact dermatitis due to plants, except food: Secondary | ICD-10-CM | POA: Diagnosis not present

## 2021-04-22 ENCOUNTER — Encounter: Payer: 59 | Admitting: Podiatry

## 2021-04-30 ENCOUNTER — Telehealth: Payer: Self-pay

## 2021-04-30 ENCOUNTER — Encounter: Payer: Self-pay | Admitting: Obstetrics & Gynecology

## 2021-04-30 ENCOUNTER — Other Ambulatory Visit: Payer: Self-pay

## 2021-04-30 ENCOUNTER — Other Ambulatory Visit (HOSPITAL_COMMUNITY)
Admission: RE | Admit: 2021-04-30 | Discharge: 2021-04-30 | Disposition: A | Discharge status: Home / Self Care | Payer: 59 | Source: Ambulatory Visit | Attending: Obstetrics & Gynecology | Admitting: Obstetrics & Gynecology

## 2021-04-30 ENCOUNTER — Ambulatory Visit (INDEPENDENT_AMBULATORY_CARE_PROVIDER_SITE_OTHER): Payer: 59 | Admitting: Obstetrics & Gynecology

## 2021-04-30 VITALS — BP 102/60 | Ht 65.0 in | Wt 200.0 lb

## 2021-04-30 DIAGNOSIS — R635 Abnormal weight gain: Secondary | ICD-10-CM | POA: Diagnosis not present

## 2021-04-30 DIAGNOSIS — Z13220 Encounter for screening for lipoid disorders: Secondary | ICD-10-CM | POA: Diagnosis not present

## 2021-04-30 DIAGNOSIS — Z124 Encounter for screening for malignant neoplasm of cervix: Secondary | ICD-10-CM | POA: Diagnosis not present

## 2021-04-30 DIAGNOSIS — N920 Excessive and frequent menstruation with regular cycle: Secondary | ICD-10-CM

## 2021-04-30 DIAGNOSIS — Z131 Encounter for screening for diabetes mellitus: Secondary | ICD-10-CM

## 2021-04-30 DIAGNOSIS — Z01419 Encounter for gynecological examination (general) (routine) without abnormal findings: Secondary | ICD-10-CM | POA: Diagnosis not present

## 2021-04-30 MED ORDER — DIAZEPAM 5 MG PO TABS: 5.0000 mg | ORAL_TABLET | Freq: Once | ORAL | 0 refills | Status: AC

## 2021-04-30 MED ORDER — MISOPROSTOL 200 MCG PO TABS: 200.0000 ug | ORAL_TABLET | Freq: Once | ORAL | 0 refills | Status: DC

## 2021-04-30 MED ORDER — PROMETHAZINE HCL 25 MG PO TABS: 25.0000 mg | ORAL_TABLET | Freq: Once | ORAL | 0 refills | Status: DC

## 2021-04-30 MED ORDER — OXYCODONE-ACETAMINOPHEN 5-325 MG PO TABS: 1.0000 | ORAL_TABLET | ORAL | 0 refills | Status: DC | PRN

## 2021-04-30 NOTE — Telephone Encounter (Signed)
Left a message for the patient to return the call.  

## 2021-04-30 NOTE — Progress Notes (Signed)
HPI:      [name] is a 35 y.o. [id number] who LMP was Patient's last menstrual period was 04/13/2021., she presents today for her annual examination. The patient has no complaints today other than worsening periods, wee separate note.  The patient is sexually active. Her last pap: approximate date 2019 and was normal. LEEP 2007. The patient does perform self breast exams.  There is no notable family history of breast or ovarian cancer in her family.  The patient has regular exercise: yes.  The patient denies current symptoms of depression.    GYN History: Contraception: tubal ligation  PMHx: Past Medical History:  Diagnosis Date   Cancer (Desiree Green)    uterine and cervical   Colitis    Migraine    1x/mo   Past Surgical History:  Procedure Laterality Date   DILATION AND CURETTAGE OF UTERUS     HERNIA REPAIR     TUBAL LIGATION     Family History  Problem Relation Age of Onset   Breast cancer Maternal Aunt    Hypertension Father    Ovarian cancer Sister    Melanoma Maternal Grandmother    Colon cancer Paternal Grandfather    Lung cancer Paternal Grandfather    Thyroid disease Mother    Social History   Tobacco Use   Smoking status: Former    Packs/day: 1.00    Years: 16.00    Pack years: 16.00    Types: Cigarettes    Quit date: 05/07/2018    Years since quitting: 2.9   Smokeless tobacco: Never   Tobacco comments:    started age 89.  currently says 2-3 cigs/day  Vaping Use   Vaping Use: Never used  Substance Use Topics   Alcohol use: Yes    Comment: rarely   Drug use: No    Current Outpatient Medications:    rizatriptan (MAXALT) 10 MG tablet, Take 10 mg by mouth as needed for migraine. May repeat in 2 hours if needed (Patient not taking: Reported on 04/30/2021), Disp: , Rfl:  Allergies: Benadryl [diphenhydramine], Ibuprofen, and Lactose intolerance (gi)  Review of Systems  Constitutional:  Negative for chills, fever and malaise/fatigue.  HENT:  Negative  for congestion, sinus pain and sore throat.   Eyes:  Negative for blurred vision and pain.  Respiratory:  Negative for cough and wheezing.   Cardiovascular:  Negative for chest pain and leg swelling.  Gastrointestinal:  Negative for abdominal pain, constipation, diarrhea, heartburn, nausea and vomiting.  Genitourinary:  Negative for dysuria, frequency, hematuria and urgency.  Musculoskeletal:  Negative for back pain, joint pain, myalgias and neck pain.  Skin:  Negative for itching and rash.  Neurological:  Negative for dizziness, tremors and weakness.  Endo/Heme/Allergies:  Does not bruise/bleed easily.  Psychiatric/Behavioral:  Negative for depression. The patient is not nervous/anxious and does not have insomnia.    Objective: BP 102/60   Ht 5\' 5"  (1.651 m)   Wt 200 lb (90.7 kg)   LMP 04/13/2021   BMI 33.28 kg/m   Filed Weights   04/30/21 0828  Weight: 200 lb (90.7 kg)   Body mass index is 33.28 kg/m. Physical Exam Constitutional:      General: She is not in acute distress.    Appearance: She is well-developed.  Genitourinary:     Bladder, rectum and urethral meatus normal.     No lesions in the vagina.     Right Labia: No rash, tenderness or lesions.  Left Labia: No tenderness, lesions or rash.    No vaginal bleeding.      Right Adnexa: not tender and no mass present.    Left Adnexa: not tender and no mass present.    No cervical motion tenderness, friability, lesion or polyp.     Uterus is not enlarged.     No uterine mass detected.    Pelvic exam was performed with patient in the lithotomy position.  Breasts:    Right: No mass, skin change or tenderness.     Left: No mass, skin change or tenderness.  HENT:     Head: Normocephalic and atraumatic. No laceration.     Right Ear: Hearing normal.     Left Ear: Hearing normal.     Mouth/Throat:     Pharynx: Uvula midline.  Eyes:     Pupils: Pupils are equal, round, and reactive to light.  Neck:     Thyroid: No  thyromegaly.  Cardiovascular:     Rate and Rhythm: Normal rate and regular rhythm.     Heart sounds: No murmur heard.   No friction rub. No gallop.  Pulmonary:     Effort: Pulmonary effort is normal. No respiratory distress.     Breath sounds: Normal breath sounds. No wheezing.  Abdominal:     General: Bowel sounds are normal. There is no distension.     Palpations: Abdomen is soft.     Tenderness: There is no abdominal tenderness. There is no rebound.  Musculoskeletal:        General: Normal range of motion.     Cervical back: Normal range of motion and neck supple.  Neurological:     Mental Status: She is alert and oriented to person, place, and time.     Cranial Nerves: No cranial nerve deficit.  Skin:    General: Skin is warm and dry.  Psychiatric:        Judgment: Judgment normal.  Vitals reviewed.    Assessment:  ANNUAL EXAM 1. Women's annual routine gynecological examination   2. Screening for cervical cancer   3. Menorrhagia with regular cycle   4. Screening for diabetes mellitus   5. Abnormal weight gain   6. Screening cholesterol level      Screening Plan:            1.  Cervical Screening-  Pap smear done today  2. Breast screening- Exam annually and mammogram>40 planned   3.  Labs today  4. Counseling for contraception:  Pt has had BTL   5. Menorrhagia, all options discussed, see note and EMB procedure for today Planning endometrial ablation (in office)    F/U  Return in about 1 year (around 04/30/2022) for Annual.  Barnett Applebaum, MD, [facility], [facility] 04/30/2021  8:55 AM

## 2021-04-30 NOTE — Progress Notes (Signed)
HPI:      [name] is a 35 y.o. [id number] who LMP was Patient's last menstrual period was 04/13/2021., presents today for a problem visit.  She complains of menorrhagia that  began several years ago and its severity is described as severe.  She has regular periods every 21 to 27 days and they are associated with moderate menstrual cramping, clots, frequent pad use, headaches, and PMS.  ALso concerned over weight gain.  She has used the following for attempts at control: maxi pad.  Previous evaluation: ultrasound showing no fibroid or mass. Prior Diagnosis: dysfunctional uterine bleeding. Previous Treatment: none. Does not desire hormonal therapy  PMHx: She  has a past medical history of Cancer ([place]), Colitis, and Migraine. Also,  has a past surgical history that includes Dilation and curettage of uterus; Hernia repair; and Tubal ligation., family history includes Breast cancer in her maternal aunt; Colon cancer in her paternal grandfather; Hypertension in her father; Lung cancer in her paternal grandfather; Melanoma in her maternal grandmother; Ovarian cancer in her sister; Thyroid disease in her mother.,  reports that she quit smoking about 2 years ago. Her smoking use included cigarettes. She has a 16.00 pack-year smoking history. She has never used smokeless tobacco. She reports current alcohol use. She reports that she does not use drugs.  She  Current Outpatient Medications:    diazepam (VALIUM) 5 MG tablet, Take 1 tablet (5 mg total) by mouth once for 1 dose. One hour prior to procedure, Disp: 2 tablet, Rfl: 0   misoprostol (CYTOTEC) 200 MCG tablet, Take 1 tablet (200 mcg total) by mouth once for 1 dose. Night before procedure, Disp: 1 tablet, Rfl: 0   oxyCODONE-acetaminophen (PERCOCET) 5-325 MG tablet, Take 1 tablet by mouth every 4 (four) hours as needed for moderate pain or severe pain. ALSO, take 2 tablets ONE HOUR PRIOR TO PROCEDURE, Disp: 20 tablet, Rfl: 0   promethazine  (PHENERGAN) 25 MG tablet, Take 1 tablet (25 mg total) by mouth once for 1 dose. One hour before procedure, Disp: 5 tablet, Rfl: 0   rizatriptan (MAXALT) 10 MG tablet, Take 10 mg by mouth as needed for migraine. May repeat in 2 hours if needed (Patient not taking: Reported on 04/30/2021), Disp: , Rfl:   Also, is allergic to benadryl [diphenhydramine], ibuprofen, and lactose intolerance (gi).  Review of Systems  Constitutional:  Positive for malaise/fatigue. Negative for chills and fever.  HENT:  Negative for congestion, sinus pain and sore throat.   Eyes:  Negative for blurred vision and pain.  Respiratory:  Negative for cough and wheezing.   Cardiovascular:  Negative for chest pain and leg swelling.  Gastrointestinal:  Negative for abdominal pain, constipation, diarrhea, heartburn, nausea and vomiting.  Genitourinary:  Negative for dysuria, frequency, hematuria and urgency.  Musculoskeletal:  Negative for back pain, joint pain, myalgias and neck pain.  Skin:  Negative for itching and rash.  Neurological:  Negative for dizziness, tremors and weakness.  Endo/Heme/Allergies:  Does not bruise/bleed easily.  Psychiatric/Behavioral:  Negative for depression. The patient is not nervous/anxious and does not have insomnia.    Objective: BP 102/60   Ht 5\' 5"  (1.651 m)   Wt 200 lb (90.7 kg)   LMP 04/13/2021   BMI 33.28 kg/m  Physical Exam Constitutional:      General: She is not in acute distress.    Appearance: She is well-developed.  Genitourinary:     Bladder, rectum and urethral meatus normal.  No lesions in the vagina.     Right Labia: No rash, tenderness or lesions.    Left Labia: No tenderness, lesions or rash.    No vaginal bleeding.      Right Adnexa: not tender and no mass present.    Left Adnexa: not tender and no mass present.    No cervical motion tenderness, friability, lesion or polyp.     Uterus is not enlarged.     No uterine mass detected.    Pelvic exam was  performed with patient in the lithotomy position.  Breasts:    Right: No mass, skin change or tenderness.     Left: No mass, skin change or tenderness.  HENT:     Head: Normocephalic and atraumatic. No laceration.     Right Ear: Hearing normal.     Left Ear: Hearing normal.     Mouth/Throat:     Pharynx: Uvula midline.  Eyes:     Pupils: Pupils are equal, round, and reactive to light.  Neck:     Thyroid: No thyromegaly.  Cardiovascular:     Rate and Rhythm: Normal rate and regular rhythm.     Heart sounds: No murmur heard.   No friction rub. No gallop.  Pulmonary:     Effort: Pulmonary effort is normal. No respiratory distress.     Breath sounds: Normal breath sounds. No wheezing.  Abdominal:     General: Bowel sounds are normal. There is no distension.     Palpations: Abdomen is soft.     Tenderness: There is no abdominal tenderness. There is no rebound.  Musculoskeletal:        General: Normal range of motion.     Cervical back: Normal range of motion and neck supple.  Neurological:     Mental Status: She is alert and oriented to person, place, and time.     Cranial Nerves: No cranial nerve deficit.  Skin:    General: Skin is warm and dry.  Psychiatric:        Judgment: Judgment normal.  Vitals reviewed.   Exam amenable to in office ablation  ASSESSMENT/PLAN:    Problem List Items Addressed This Visit     Menorrhagia with regular cycle   Relevant Orders   Surgical pathology from E[facility] today  Patient was told that it is normal to have menstrual bleeding after an endometrial ablation, only about 80% of patients become amenorrheic, 10% of patients have normal or light periods, and 10% of patients have no change in their bleeding pattern and may need further intervention.  She was told she will observe her periods for a few months after her ablation to see what her periods will be like; it is recommended to wait until at least three months after the procedure before making  conclusions about how periods are going to be like after an ablation.  Medications discussed for before and after the procedure. Rx given. Consent obtained    Endometrial Biopsy After discussion with the patient regarding her abnormal uterine bleeding I recommended that she proceed with an endometrial biopsy for further diagnosis. The risks, benefits, alternatives, and indications for an endometrial biopsy were discussed with the patient in detail. She understood the risks including infection, bleeding, cervical laceration and uterine perforation.  Verbal consent was obtained.   PROCEDURE NOTE:  Pipelle endometrial biopsy was performed using aseptic technique with iodine preparation.  The uterus was sounded to a length of 8 cm.  Adequate sampling was obtained  with minimal blood loss.  The patient tolerated the procedure well.  Disposition will be pending pathology.   Barnett Applebaum, MD, [facility], [facility] 04/30/2021  9:09 AM

## 2021-04-30 NOTE — Patient Instructions (Addendum)
Thank you for choosing [facility]. As part of our ongoing efforts to improve patient experience, we would appreciate your feedback. Please fill out the short survey that you will receive by mail or MyChart. Your opinion is important to [place]! -Dr Kenton Kingfisher    Endometrial Ablation Pre-Procedural Instructions for Patient   You may have a light meal prior to coming to the office if desired.    You can plan on your appointment taking about 1 hour.  The actual procedure lasts for 1/2 hour but you will need to remain in the office for a short period after the procedure.    You may feel drowsy from the medication administered prior to and during the procedure. You should arrange for transportation to and from the office.    The following medications have been prescribed to you.  Please follow the doctor's instructions in taking these medications:  Day before Procedure    Cytotec 200 mg vaginally at bedtime    Tylenol 500 mg one by mouth three times a day for 2 days before procedure    Day of Procedure  Phenergan 25 mg  by mouth 1 hour before procedure Percocet 5/325 mg 2 by mouth 1 hour before procedure Valium 5mg   1 by mouth 1 hour before procedure  Percocet 5/325 mg 1 to 2 by mouth every 4-5 hours as needed for pain Tylenol 500 mg 1 by mouth every 8 hours as needed for pain

## 2021-05-01 DIAGNOSIS — R635 Abnormal weight gain: Secondary | ICD-10-CM | POA: Diagnosis not present

## 2021-05-01 DIAGNOSIS — E781 Pure hyperglyceridemia: Secondary | ICD-10-CM | POA: Diagnosis not present

## 2021-05-01 DIAGNOSIS — R7303 Prediabetes: Secondary | ICD-10-CM | POA: Diagnosis not present

## 2021-05-01 LAB — SURGICAL PATHOLOGY

## 2021-05-01 LAB — HEMOGLOBIN A1C
Est. average glucose Bld gHb Est-mCnc: 120 mg/dL
Hgb A1c MFr Bld: 5.8 % — ABNORMAL HIGH (ref 4.8–5.6)

## 2021-05-01 LAB — LIPID PANEL
Chol/HDL Ratio: 3.9 ratio (ref 0.0–4.4)
Cholesterol, Total: 178 mg/dL (ref 100–199)
HDL: 46 mg/dL (ref >39)
LDL Chol Calc (NIH): 95 mg/dL (ref 0–99)
Triglycerides: 215 mg/dL — ABNORMAL HIGH (ref 0–149)
VLDL Cholesterol Cal: 37 mg/dL (ref 5–40)

## 2021-05-01 LAB — TSH: TSH: 2.02 u[IU]/mL (ref 0.450–4.500)

## 2021-05-01 NOTE — Progress Notes (Signed)
This encounter was created in error - please disregard.

## 2021-05-02 LAB — CYTOLOGY - PAP
Comment: NEGATIVE
Diagnosis: NEGATIVE
Diagnosis: REACTIVE
High risk HPV: NEGATIVE

## 2021-05-02 NOTE — Telephone Encounter (Signed)
-----   Message from Gae Dry, MD sent at 04/30/2021  9:00 AM EST ----- Regarding: [facility] in office Surgery Booking Request Patient Full Name:  Desiree Green  MRN: [id number]  DOB: 12/16/1985  Surgeon: Hoyt Koch, MD  Requested Surgery Date and Time: Week of Dec 19 Primary Diagnosis AND Code: Menorrhagia Secondary Diagnosis and Code: N92.0 Surgical Procedure: Hysteroscopy D&C with Ablation IN OFFICE Special Case Needs: MINERVA H&P: No, Done today

## 2021-05-02 NOTE — Telephone Encounter (Signed)
LVM for patient and scheduled Hysteroscopy D&C with Ablation IN OFFICE w Kenton Kingfisher.  Pt has seen scheduled appt in my chart.  DOS 12/23 @ 9:00  H&P done 11/29    Almedia Balls has been informed and is available. He also advised that he can drop off equipment as well since [facility] doesn't need him.

## 2021-05-03 ENCOUNTER — Telehealth: Payer: Self-pay | Admitting: Family Medicine

## 2021-05-03 ENCOUNTER — Telehealth: Payer: 59 | Admitting: Physician Assistant

## 2021-05-03 DIAGNOSIS — G43809 Other migraine, not intractable, without status migrainosus: Secondary | ICD-10-CM | POA: Diagnosis not present

## 2021-05-03 MED ORDER — RIZATRIPTAN BENZOATE 10 MG PO TABS
10.0000 mg | ORAL_TABLET | ORAL | 0 refills | Status: DC | PRN
Start: 2021-05-03 — End: 2021-05-06

## 2021-05-03 NOTE — Telephone Encounter (Signed)
Patient former patient of Dr Marlyn Corporal was precribed rizatriptan (MAXALT) 10 MG tablet , was told by pharmay and isnsurance that mout has to be changed to per month or 30 day supply to be covered

## 2021-05-03 NOTE — Progress Notes (Signed)
Virtual Visit Consent   Desiree Green, you are scheduled for a virtual visit with a [facility] provider today.     Just as with appointments in the office, your consent must be obtained to participate.  Your consent will be active for this visit and any virtual visit you may have with one of our providers in the next 365 days.     If you have a MyChart account, a copy of this consent can be sent to you electronically.  All virtual visits are billed to your insurance company just like a traditional visit in the office.    As this is a virtual visit, video technology does not allow for your provider to perform a traditional examination.  This may limit your provider's ability to fully assess your condition.  If your provider identifies any concerns that need to be evaluated in person or the need to arrange testing (such as labs, EKG, etc.), we will make arrangements to do so.     Although advances in technology are sophisticated, we cannot ensure that it will always work on either your end or our end.  If the connection with a video visit is poor, the visit may have to be switched to a telephone visit.  With either a video or telephone visit, we are not always able to ensure that we have a secure connection.     I need to obtain your verbal consent now.   Are you willing to proceed with your visit today?    Desiree Green has provided verbal consent on 05/03/2021 for a virtual visit (video or telephone).   Mar Daring, PA-C   Date: 05/03/2021 2:07 PM   Virtual Visit via Video Note   I, Mar Daring, connected with  Desiree Green  ([id number], February 13, 1986) on 05/03/21 at  2:00 PM EST by a video-enabled telemedicine application and verified that I am speaking with the correct person using two identifiers.  Location: Patient: Virtual Visit Location Patient: Home Provider: Virtual Visit Location Provider: Home Office   I discussed the limitations of evaluation and  management by telemedicine and the availability of in person appointments. The patient expressed understanding and agreed to proceed.    History of Present Illness: Desiree Green is a 35 y.o. who identifies as a female who was assigned female at birth, and is being seen today for migraines. Needing a refill of Maxalt. Has a history of migraines and has run out of medication. She reports she has noticed over the last month and a half or so the migraines have been more frequent. She reports this current one has been present since Tuesday and is causing nausea as well. She is scheduled to see her PCP in 2 weeks.    Problems:  Patient Active Problem List   Diagnosis Date Noted   Menorrhagia with regular cycle 04/30/2021   Cervical cancer ([place]) 12/19/2019   Metrorrhagia 02/24/2018   Overweight (BMI 25.0-29.9) 02/24/2018   Headache 24/23/5361   Umbilical hernia 44/31/5400   Disorder of sacrum 04/21/2012   Low back pain 04/21/2012    Allergies:  Allergies  Allergen Reactions   Benadryl [Diphenhydramine] Hives   Ibuprofen Hives   Lactose Intolerance (Gi) Diarrhea   Medications:  Current Outpatient Medications:    misoprostol (CYTOTEC) 200 MCG tablet, Take 1 tablet (200 mcg total) by mouth once for 1 dose. Night before procedure, Disp: 1 tablet, Rfl: 0   oxyCODONE-acetaminophen (PERCOCET) 5-325 MG tablet, Take  1 tablet by mouth every 4 (four) hours as needed for moderate pain or severe pain. ALSO, take 2 tablets ONE HOUR PRIOR TO PROCEDURE, Disp: 20 tablet, Rfl: 0   promethazine (PHENERGAN) 25 MG tablet, Take 1 tablet (25 mg total) by mouth once for 1 dose. One hour before procedure, Disp: 5 tablet, Rfl: 0   rizatriptan (MAXALT) 10 MG tablet, Take 1 tablet (10 mg total) by mouth as needed for migraine. May repeat in 2 hours if needed, Disp: 10 tablet, Rfl: 0  Observations/Objective: Patient is well-developed, well-nourished in no acute distress.  Resting comfortably at home.  Head is  normocephalic, atraumatic.  No labored breathing.  Speech is clear and coherent with logical content.  Patient is alert and oriented at baseline.    Assessment and Plan: 1. Other migraine without status migrainosus, not intractable - rizatriptan (MAXALT) 10 MG tablet; Take 1 tablet (10 mg total) by mouth as needed for migraine. May repeat in 2 hours if needed  Dispense: 10 tablet; Refill: 0  - Maxalt refilled - Advised to discuss migraines with PCP to see if preventative therapy is recommended since the migraines have become more frequent - Push fluids - Rest  Follow Up Instructions: I discussed the assessment and treatment plan with the patient. The patient was provided an opportunity to ask questions and all were answered. The patient agreed with the plan and demonstrated an understanding of the instructions.  A copy of instructions were sent to the patient via MyChart unless otherwise noted below.    The patient was advised to call back or seek an in-person evaluation if the symptoms worsen or if the condition fails to improve as anticipated.  Time:  I spent 10 minutes with the patient via telehealth technology discussing the above problems/concerns.    Mar Daring, PA-C

## 2021-05-03 NOTE — Telephone Encounter (Signed)
Pt called back to report that her insurance company will not cover a 15 day supply. She needs a revised Rx that states she needs however many pills prescribed but specifically for 30 days and not 15. At least one month supply. This needs to be sent to [facility] telehealth. Pt called [facility] however provider is no longer at office. Please advise   Only routing to triage so it can be routed to jennifer burnette

## 2021-05-03 NOTE — Patient Instructions (Signed)
[place], thank you for joining Mar Daring, PA-C for today's virtual visit.  While this provider is not your primary care provider (PCP), if your PCP is located in our provider database this encounter information will be shared with them immediately following your visit.  Consent: (Patient) Desiree Green provided verbal consent for this virtual visit at the beginning of the encounter.  Current Medications:  Current Outpatient Medications:    misoprostol (CYTOTEC) 200 MCG tablet, Take 1 tablet (200 mcg total) by mouth once for 1 dose. Night before procedure, Disp: 1 tablet, Rfl: 0   oxyCODONE-acetaminophen (PERCOCET) 5-325 MG tablet, Take 1 tablet by mouth every 4 (four) hours as needed for moderate pain or severe pain. ALSO, take 2 tablets ONE HOUR PRIOR TO PROCEDURE, Disp: 20 tablet, Rfl: 0   promethazine (PHENERGAN) 25 MG tablet, Take 1 tablet (25 mg total) by mouth once for 1 dose. One hour before procedure, Disp: 5 tablet, Rfl: 0   rizatriptan (MAXALT) 10 MG tablet, Take 1 tablet (10 mg total) by mouth as needed for migraine. May repeat in 2 hours if needed, Disp: 10 tablet, Rfl: 0   Medications ordered in this encounter:  Meds ordered this encounter  Medications   rizatriptan (MAXALT) 10 MG tablet    Sig: Take 1 tablet (10 mg total) by mouth as needed for migraine. May repeat in 2 hours if needed    Dispense:  10 tablet    Refill:  0    Order Specific Question:   Supervising Provider    Answer:   Sabra Heck, [place]     *If you need refills on other medications prior to your next appointment, please contact your pharmacy*  Follow-Up: Call back or seek an in-person evaluation if the symptoms worsen or if the condition fails to improve as anticipated.  Other Instructions Migraine Headache A migraine headache is an intense, throbbing pain on one side or both sides of the head. Migraine headaches may also cause other symptoms, such as nausea, vomiting, and  sensitivity to light and noise. A migraine headache can last from 4 hours to 3 days. Talk with your doctor about what things may bring on (trigger) your migraine headaches. What are the causes? The exact cause of this condition is not known. However, a migraine may be caused when nerves in the brain become irritated and release chemicals that cause inflammation of blood vessels. This inflammation causes pain. This condition may be triggered or caused by: Drinking alcohol. Smoking. Taking medicines, such as: Medicine used to treat chest pain (nitroglycerin). Birth control pills. Estrogen. Certain blood pressure medicines. Eating or drinking products that contain nitrates, glutamate, aspartame, or tyramine. Aged cheeses, chocolate, or caffeine may also be triggers. Doing physical activity. Other things that may trigger a migraine headache include: Menstruation. Pregnancy. Hunger. Stress. Lack of sleep or too much sleep. Weather changes. Fatigue. What increases the risk? The following factors may make you more likely to experience migraine headaches: Being a certain age. This condition is more common in people who are 30-1 years old. Being female. Having a family history of migraine headaches. Being Caucasian. Having a mental health condition, such as depression or anxiety. Being obese. What are the signs or symptoms? The main symptom of this condition is pulsating or throbbing pain. This pain may: Happen in any area of the head, such as on one side or both sides. Interfere with daily activities. Get worse with physical activity. Get worse with exposure to  bright lights or loud noises. Other symptoms may include: Nausea. Vomiting. Dizziness. General sensitivity to bright lights, loud noises, or smells. Before you get a migraine headache, you may get warning signs (an aura). An aura may include: Seeing flashing lights or having blind spots. Seeing bright spots, halos, or zigzag  lines. Having tunnel vision or blurred vision. Having numbness or a tingling feeling. Having trouble talking. Having muscle weakness. Some people have symptoms after a migraine headache (postdromal phase), such as: Feeling tired. Difficulty concentrating. How is this diagnosed? A migraine headache can be diagnosed based on: Your symptoms. A physical exam. Tests, such as: CT scan or an MRI of the head. These imaging tests can help rule out other causes of headaches. Taking fluid from the spine (lumbar puncture) and analyzing it (cerebrospinal fluid analysis, or CSF analysis). How is this treated? This condition may be treated with medicines that: Relieve pain. Relieve nausea. Prevent migraine headaches. Treatment for this condition may also include: Acupuncture. Lifestyle changes like avoiding foods that trigger migraine headaches. Biofeedback. Cognitive behavioral therapy. Follow these instructions at home: Medicines Take over-the-counter and prescription medicines only as told by your health care provider. Ask your health care provider if the medicine prescribed to you: Requires you to avoid driving or using heavy machinery. Can cause constipation. You may need to take these actions to prevent or treat constipation: Drink enough fluid to keep your urine pale yellow. Take over-the-counter or prescription medicines. Eat foods that are high in fiber, such as beans, whole grains, and fresh fruits and vegetables. Limit foods that are high in fat and processed sugars, such as fried or sweet foods. Lifestyle Do not drink alcohol. Do not use any products that contain nicotine or tobacco, such as cigarettes, e-cigarettes, and chewing tobacco. If you need help quitting, ask your health care provider. Get at least 8 hours of sleep every night. Find ways to manage stress, such as meditation, deep breathing, or yoga. General instructions   Keep a journal to find out what may trigger  your migraine headaches. For example, write down: What you eat and drink. How much sleep you get. Any change to your diet or medicines. If you have a migraine headache: Avoid things that make your symptoms worse, such as bright lights. It may help to lie down in a dark, quiet room. Do not drive or use heavy machinery. Ask your health care provider what activities are safe for you while you are experiencing symptoms. Keep all follow-up visits as told by your health care provider. This is important. Contact a health care provider if: You develop symptoms that are different or more severe than your usual migraine headache symptoms. You have more than 15 headache days in one month. Get help right away if: Your migraine headache becomes severe. Your migraine headache lasts longer than 72 hours. You have a fever. You have a stiff neck. You have vision loss. Your muscles feel weak or like you cannot control them. You start to lose your balance often. You have trouble walking. You faint. You have a seizure. Summary A migraine headache is an intense, throbbing pain on one side or both sides of the head. Migraines may also cause other symptoms, such as nausea, vomiting, and sensitivity to light and noise. This condition may be treated with medicines and lifestyle changes. You may also need to avoid certain things that trigger a migraine headache. Keep a journal to find out what may trigger your migraine headaches. Contact your health  care provider if you have more than 15 headache days in a month or you develop symptoms that are different or more severe than your usual migraine headache symptoms. This information is not intended to replace advice given to you by your health care provider. Make sure you discuss any questions you have with your health care provider. Document Revised: 09/10/2018 Document Reviewed: 07/01/2018 Elsevier Patient Education  2022 [employer].    If you have been  instructed to have an in-person evaluation today at a local Urgent Care facility, please use the link below. It will take you to a list of all of our available [facility], including address, phone number and hours of operation. Please do not delay care.  [facility]  If you or a family member do not have a primary care provider, use the link below to schedule a visit and establish care. When you choose a [facility] primary care physician or advanced practice provider, you gain a long-term partner in health. Find a Primary Care Provider  Learn more about [facility]'s in-office and virtual care options: [facility]

## 2021-05-04 NOTE — Telephone Encounter (Signed)
Routing directly to provider.

## 2021-05-06 ENCOUNTER — Other Ambulatory Visit: Payer: Self-pay | Admitting: Physician Assistant

## 2021-05-06 ENCOUNTER — Telehealth: Payer: Self-pay

## 2021-05-06 ENCOUNTER — Encounter: Payer: Self-pay | Admitting: Physician Assistant

## 2021-05-06 DIAGNOSIS — G43809 Other migraine, not intractable, without status migrainosus: Secondary | ICD-10-CM

## 2021-05-06 MED ORDER — RIZATRIPTAN BENZOATE 10 MG PO TABS
10.0000 mg | ORAL_TABLET | ORAL | 0 refills | Status: DC | PRN
Start: 2021-05-06 — End: 2021-05-06

## 2021-05-06 MED ORDER — RIZATRIPTAN BENZOATE 10 MG PO TABS: 10.0000 mg | ORAL_TABLET | ORAL | 0 refills | Status: DC | PRN

## 2021-05-06 NOTE — Telephone Encounter (Signed)
error 

## 2021-05-06 NOTE — Telephone Encounter (Signed)
Changed to qty 20 from 10 to reflect 30 day supply and sent to [facility] on file

## 2021-05-08 ENCOUNTER — Encounter: Payer: Self-pay | Admitting: Obstetrics and Gynecology

## 2021-05-17 ENCOUNTER — Telehealth: Payer: 59 | Admitting: Nurse Practitioner

## 2021-05-17 DIAGNOSIS — Z20822 Contact with and (suspected) exposure to covid-19: Secondary | ICD-10-CM | POA: Diagnosis not present

## 2021-05-17 DIAGNOSIS — R5081 Fever presenting with conditions classified elsewhere: Secondary | ICD-10-CM

## 2021-05-17 DIAGNOSIS — R051 Acute cough: Secondary | ICD-10-CM | POA: Diagnosis not present

## 2021-05-17 MED ORDER — NIRMATRELVIR/RITONAVIR (PAXLOVID)TABLET: 3.0000 | ORAL_TABLET | Freq: Two times a day (BID) | ORAL | 0 refills | Status: AC

## 2021-05-17 MED ORDER — BENZONATATE 100 MG PO CAPS: 100.0000 mg | ORAL_CAPSULE | Freq: Two times a day (BID) | ORAL | 0 refills | Status: DC

## 2021-05-17 NOTE — Patient Instructions (Signed)
You are being prescribed PAXLOVID for COVID-19 infection.      Please call the pharmacy or go through the drive through vs going inside if you are picking up the mediation yourself to prevent further spread. If prescribed to a [facility] affiliated pharmacy, a pharmacist will bring the medication out to your car.   Medications to hold while taking this treatment: none  *If asked to hold, you can resume them 24 hours after your last dose   ADMINISTRATION INSTRUCTIONS: Take with or without food. Swallow the tablets whole. Don't chew, crush, or break the medications because it might not work as well  For each dose of the medication, you should be taking 3 tablets together (2 pink oval and 1 white oval) TWICE a day for FIVE days   Finish your full five-day course of Paxlovid even if you feel better before you're done. Stopping this medication too early can make it less effective to prevent severe illness related to COVID19.    Paxlovid is prescribed for YOU ONLY. Don't share it with others, even if they have similar symptoms as you. This medication might not be right for everyone.  Make sure to take steps to protect yourself and others while you're taking this medication in order to get well soon and to prevent others from getting sick with COVID-19.  Paxlovid (nirmatrelvir / ritonavir) can cause hormonal birth control medications to not work well. If you or your partner is currently taking hormonal birth control, use condoms or other birth control methods to prevent unintended pregnancies.    COMMON SIDE EFFECTS: Altered or bad taste in your mouth  Diarrhea  High blood pressure (1% of people) Muscle aches (1% of people)     If your COVID-19 symptoms get worse, get medical help right away. Call 911 if you experience symptoms such as worsening cough, trouble breathing, chest pain that doesn't go away, confusion, a hard time staying awake, and pale or blue-colored skin. This medication  won't prevent all COVID-19 cases from getting worse.     

## 2021-05-17 NOTE — Progress Notes (Signed)
Virtual Visit Consent   Desiree Green, you are scheduled for a virtual visit with Mary-Margaret Hassell Done, [place], a [facility] provider, today.     Just as with appointments in the office, your consent must be obtained to participate.  Your consent will be active for this visit and any virtual visit you may have with one of our providers in the next 365 days.     If you have a MyChart account, a copy of this consent can be sent to you electronically.  All virtual visits are billed to your insurance company just like a traditional visit in the office.    As this is a virtual visit, video technology does not allow for your provider to perform a traditional examination.  This may limit your provider's ability to fully assess your condition.  If your provider identifies any concerns that need to be evaluated in person or the need to arrange testing (such as labs, EKG, etc.), we will make arrangements to do so.     Although advances in technology are sophisticated, we cannot ensure that it will always work on either your end or our end.  If the connection with a video visit is poor, the visit may have to be switched to a telephone visit.  With either a video or telephone visit, we are not always able to ensure that we have a secure connection.     I need to obtain your verbal consent now.   Are you willing to proceed with your visit today? [place] has provided verbal consent on 05/17/2021 for a virtual visit (video or telephone).   Mary-Margaret Hassell Done, FNP   Date: 05/17/2021 7:05 PM   Virtual Visit via Video Note   I, Mary-Margaret Hassell Done, connected with Desiree Green ([id number], 1985/12/22) on 05/17/21 at  7:15 PM EST by a video-enabled telemedicine application and verified that I am speaking with the correct person using two identifiers.  Location: Patient: Virtual Visit Location Patient: Home Provider: Virtual Visit Location Provider: Mobile   I discussed the  limitations of evaluation and management by telemedicine and the availability of in person appointments. The patient expressed understanding and agreed to proceed.    History of Present Illness: Desiree Green is a 35 y.o. who identifies as a female who was assigned female at birth, and is being seen today for covid exposure .  HPI: Patient states that her husband tested positive for covid yesterday. She developed cough, congestion and headache that just started today. Covid test was negative. Just needs something for cough.   Review of Systems  Constitutional:  Positive for chills, fever and malaise/fatigue.  HENT:  Positive for congestion and sore throat.   Respiratory:  Positive for cough and sputum production.   Musculoskeletal:  Positive for myalgias.  Neurological:  Positive for headaches.   Problems:  Patient Active Problem List   Diagnosis Date Noted   Menorrhagia with regular cycle 04/30/2021   Cervical cancer ([place]) 12/19/2019   Metrorrhagia 02/24/2018   Overweight (BMI 25.0-29.9) 02/24/2018   Headache 39/08/90   Umbilical hernia 33/00/7622   Disorder of sacrum 04/21/2012   Low back pain 04/21/2012    Allergies:  Allergies  Allergen Reactions   Benadryl [Diphenhydramine] Hives   Ibuprofen Hives   Lactose Intolerance (Gi) Diarrhea   Medications:  Current Outpatient Medications:    misoprostol (CYTOTEC) 200 MCG tablet, Take 1 tablet (200 mcg total) by mouth once for 1 dose. Night  before procedure, Disp: 1 tablet, Rfl: 0   oxyCODONE-acetaminophen (PERCOCET) 5-325 MG tablet, Take 1 tablet by mouth every 4 (four) hours as needed for moderate pain or severe pain. ALSO, take 2 tablets ONE HOUR PRIOR TO PROCEDURE, Disp: 20 tablet, Rfl: 0   promethazine (PHENERGAN) 25 MG tablet, Take 1 tablet (25 mg total) by mouth once for 1 dose. One hour before procedure, Disp: 5 tablet, Rfl: 0   rizatriptan (MAXALT) 10 MG tablet, Take 1 tablet (10 mg total) by mouth as needed for  migraine. May repeat in 2 hours if needed; this is a 30 day supply, Disp: 10 tablet, Rfl: 0  Observations/Objective: Patient is well-developed, well-nourished in no acute distress.  Resting comfortably  at home.  Head is normocephalic, atraumatic.  No labored breathing.  Speech is clear and coherent with logical content.  Patient is alert and oriented at baseline.  Deep dry cough Voice raspy  Assessment and Plan:  [name] in today with chief complaint of covid exposure  1. Close exposure to COVID-19 viru  2. Acute cough  3. Fever in other diseases 1. Take meds as prescribed 2. Use a cool mist humidifier especially during the winter months and when heat has been humid. 3. Use saline nose sprays frequently 4. Saline irrigations of the nose can be very helpful if done frequently.  * 4X daily for 1 week*  * Use of a nettie pot can be helpful with this. Follow directions with this* 5. Drink plenty of fluids 6. Keep thermostat turn down low 7.For any cough or congestion- tessalon 8. For fever or aces or pains- take tylenol or ibuprofen appropriate for age and weight.  * for fevers greater than 101 orally you may alternate ibuprofen and tylenol every  3 hours.   Meds ordered this encounter  Medications   nirmatrelvir/ritonavir EUA (PAXLOVID) 20 x 150 MG & 10 x 100MG  TABS    Sig: Take 3 tablets by mouth 2 (two) times daily for 5 days. (Take nirmatrelvir 150 mg two tablets twice daily for 5 days and ritonavir 100 mg one tablet twice daily for 5 days) Patient GFR is 98    Dispense:  30 tablet    Refill:  0    Order Specific Question:   Supervising Provider    Answer:   Sabra Heck, BRIAN [[id number]]   benzonatate (TESSALON PERLES) 100 MG capsule    Sig: Take 1 capsule (100 mg total) by mouth 2 (two) times daily.    Dispense:  20 capsule    Refill:  0    Order Specific Question:   Supervising Provider    Answer:   Noemi Chapel [[id number]]       Follow Up Instructions: I  discussed the assessment and treatment plan with the patient. The patient was provided an opportunity to ask questions and all were answered. The patient agreed with the plan and demonstrated an understanding of the instructions.  A copy of instructions were sent to the patient via MyChart.  The patient was advised to call back or seek an in-person evaluation if the symptoms worsen or if the condition fails to improve as anticipated.  Time:  I spent 10 minutes with the patient via telehealth technology discussing the above problems/concerns.    Mary-Margaret Hassell Done, FNP

## 2021-05-24 ENCOUNTER — Ambulatory Visit: Payer: 59 | Admitting: Obstetrics & Gynecology

## 2021-05-25 ENCOUNTER — Telehealth: Payer: 59 | Admitting: Nurse Practitioner

## 2021-05-25 DIAGNOSIS — U099 Post covid-19 condition, unspecified: Secondary | ICD-10-CM

## 2021-05-25 DIAGNOSIS — R053 Chronic cough: Secondary | ICD-10-CM | POA: Diagnosis not present

## 2021-05-25 MED ORDER — PREDNISONE 20 MG PO TABS: 40.0000 mg | ORAL_TABLET | Freq: Every day | ORAL | 0 refills | Status: AC

## 2021-05-25 MED ORDER — PROMETHAZINE-DM 6.25-15 MG/5ML PO SYRP: 5.0000 mL | ORAL_SOLUTION | Freq: Four times a day (QID) | ORAL | 0 refills | Status: DC | PRN

## 2021-05-25 NOTE — Patient Instructions (Signed)
°  Desiree Green, thank you for joining Desiree Pounds, NP for today's virtual visit.  While this provider is not your primary care provider (PCP), if your PCP is located in our provider database this encounter information will be shared with them immediately following your visit.  Consent: (Patient) Desiree Green provided verbal consent for this virtual visit at the beginning of the encounter.  Current Medications:  Current Outpatient Medications:    predniSONE (DELTASONE) 20 MG tablet, Take 2 tablets (40 mg total) by mouth daily with breakfast for 5 days., Disp: 10 tablet, Rfl: 0   promethazine-dextromethorphan (PROMETHAZINE-DM) 6.25-15 MG/5ML syrup, Take 5 mLs by mouth 4 (four) times daily as needed for cough., Disp: 240 mL, Rfl: 0   benzonatate (TESSALON PERLES) 100 MG capsule, Take 1 capsule (100 mg total) by mouth 2 (two) times daily., Disp: 20 capsule, Rfl: 0   misoprostol (CYTOTEC) 200 MCG tablet, Take 1 tablet (200 mcg total) by mouth once for 1 dose. Night before procedure, Disp: 1 tablet, Rfl: 0   oxyCODONE-acetaminophen (PERCOCET) 5-325 MG tablet, Take 1 tablet by mouth every 4 (four) hours as needed for moderate pain or severe pain. ALSO, take 2 tablets ONE HOUR PRIOR TO PROCEDURE, Disp: 20 tablet, Rfl: 0   promethazine (PHENERGAN) 25 MG tablet, Take 1 tablet (25 mg total) by mouth once for 1 dose. One hour before procedure, Disp: 5 tablet, Rfl: 0   rizatriptan (MAXALT) 10 MG tablet, Take 1 tablet (10 mg total) by mouth as needed for migraine. May repeat in 2 hours if needed; this is a 30 day supply, Disp: 10 tablet, Rfl: 0   Medications ordered in this encounter:  Meds ordered this encounter  Medications   predniSONE (DELTASONE) 20 MG tablet    Sig: Take 2 tablets (40 mg total) by mouth daily with breakfast for 5 days.    Dispense:  10 tablet    Refill:  0    Order Specific Question:   Supervising Provider    Answer:   Noemi Chapel [[id number]]    promethazine-dextromethorphan (PROMETHAZINE-DM) 6.25-15 MG/5ML syrup    Sig: Take 5 mLs by mouth 4 (four) times daily as needed for cough.    Dispense:  240 mL    Refill:  0    Order Specific Question:   Supervising Provider    Answer:   Sabra Heck, BRIAN [[id number]]     *If you need refills on other medications prior to your next appointment, please contact your pharmacy*  Follow-Up: Call back or seek an in-person evaluation if the symptoms worsen or if the condition fails to improve as anticipated.  Other Instructions Follow up with PCP or urgent care if cough not improved in 5 days   If you have been instructed to have an in-person evaluation today at a local Urgent Care facility, please use the link below. It will take you to a list of all of our available [facility], including address, phone number and hours of operation. Please do not delay care.  [facility]  If you or a family member do not have a primary care provider, use the link below to schedule a visit and establish care. When you choose a [facility] primary care physician or advanced practice provider, you gain a long-term partner in health. Find a Primary Care Provider  Learn more about [facility]'s in-office and virtual care options: [facility]

## 2021-05-25 NOTE — Progress Notes (Signed)
Virtual Visit Consent   Pamla Green, you are scheduled for a virtual visit with a [facility] provider today.     Just as with appointments in the office, your consent must be obtained to participate.  Your consent will be active for this visit and any virtual visit you may have with one of our providers in the next 365 days.     If you have a MyChart account, a copy of this consent can be sent to you electronically.  All virtual visits are billed to your insurance company just like a traditional visit in the office.    As this is a virtual visit, video technology does not allow for your provider to perform a traditional examination.  This may limit your provider's ability to fully assess your condition.  If your provider identifies any concerns that need to be evaluated in person or the need to arrange testing (such as labs, EKG, etc.), we will make arrangements to do so.     Although advances in technology are sophisticated, we cannot ensure that it will always work on either your end or our end.  If the connection with a video visit is poor, the visit may have to be switched to a telephone visit.  With either a video or telephone visit, we are not always able to ensure that we have a secure connection.     I need to obtain your verbal consent now.   Are you willing to proceed with your visit today?    Desiree Green has provided verbal consent on 05/25/2021 for a virtual visit (video or telephone).   Gildardo Pounds, NP   Date: 05/25/2021 12:23 PM   Virtual Visit via Video Note   I, Gildardo Pounds, connected with  Desiree Green  ([id number], 12-Jan-1986) on 05/25/21 at 12:15 PM EST by a video-enabled telemedicine application and verified that I am speaking with the correct person using two identifiers.  Location: Patient: Virtual Visit Location Patient: Home Provider: Virtual Visit Location Provider: Home Office   I discussed the limitations of evaluation and management  by telemedicine and the availability of in person appointments. The patient expressed understanding and agreed to proceed.    History of Present Illness: Desiree Green is a 35 y.o. who identifies as a female who was assigned female at birth, and is being seen today for post covid cough.  HPI:  Tested positive for covid 05-17-2021 and was treated with paxlovid last week. NOW with complaints of persistent productive cough of yellow thick sputum, sneezing and chest congestion. She is not a smoker (stopped 3 years ago) and does not have a history of allergies or asthma   Problems:  Patient Active Problem List   Diagnosis Date Noted   Menorrhagia with regular cycle 04/30/2021   Cervical cancer ([place]) 12/19/2019   Metrorrhagia 02/24/2018   Overweight (BMI 25.0-29.9) 02/24/2018   Headache 82/42/3536   Umbilical hernia 14/43/1540   Disorder of sacrum 04/21/2012   Low back pain 04/21/2012    Allergies:  Allergies  Allergen Reactions   Benadryl [Diphenhydramine] Hives   Ibuprofen Hives   Lactose Intolerance (Gi) Diarrhea   Medications:  Current Outpatient Medications:    predniSONE (DELTASONE) 20 MG tablet, Take 2 tablets (40 mg total) by mouth daily with breakfast for 5 days., Disp: 10 tablet, Rfl: 0   promethazine-dextromethorphan (PROMETHAZINE-DM) 6.25-15 MG/5ML syrup, Take 5 mLs by mouth 4 (four) times daily as needed for cough., Disp: 240  mL, Rfl: 0   benzonatate (TESSALON PERLES) 100 MG capsule, Take 1 capsule (100 mg total) by mouth 2 (two) times daily., Disp: 20 capsule, Rfl: 0   misoprostol (CYTOTEC) 200 MCG tablet, Take 1 tablet (200 mcg total) by mouth once for 1 dose. Night before procedure, Disp: 1 tablet, Rfl: 0   oxyCODONE-acetaminophen (PERCOCET) 5-325 MG tablet, Take 1 tablet by mouth every 4 (four) hours as needed for moderate pain or severe pain. ALSO, take 2 tablets ONE HOUR PRIOR TO PROCEDURE, Disp: 20 tablet, Rfl: 0   promethazine (PHENERGAN) 25 MG tablet, Take 1  tablet (25 mg total) by mouth once for 1 dose. One hour before procedure, Disp: 5 tablet, Rfl: 0   rizatriptan (MAXALT) 10 MG tablet, Take 1 tablet (10 mg total) by mouth as needed for migraine. May repeat in 2 hours if needed; this is a 30 day supply, Disp: 10 tablet, Rfl: 0  Observations/Objective: Patient is well-developed, well-nourished in no acute distress.  Resting comfortably at home.  Head is normocephalic, atraumatic.  No labored breathing.  Speech is clear and coherent with logical content.  Patient is alert and oriented at baseline.    Assessment and Plan: 1. Post-COVID chronic cough - predniSONE (DELTASONE) 20 MG tablet; Take 2 tablets (40 mg total) by mouth daily with breakfast for 5 days.  Dispense: 10 tablet; Refill: 0 - promethazine-dextromethorphan (PROMETHAZINE-DM) 6.25-15 MG/5ML syrup; Take 5 mLs by mouth 4 (four) times daily as needed for cough.  Dispense: 240 mL; Refill: 0    Follow Up Instructions: I discussed the assessment and treatment plan with the patient. The patient was provided an opportunity to ask questions and all were answered. The patient agreed with the plan and demonstrated an understanding of the instructions.  A copy of instructions were sent to the patient via MyChart unless otherwise noted below.     The patient was advised to call back or seek an in-person evaluation if the symptoms worsen or if the condition fails to improve as anticipated.  Time:  I spent 10 minutes with the patient via telehealth technology discussing the above problems/concerns.    Gildardo Pounds, NP

## 2021-05-28 NOTE — Telephone Encounter (Signed)
Per Epic patient cancelled appointment

## 2021-06-04 ENCOUNTER — Encounter: Payer: Self-pay | Admitting: Family Medicine

## 2021-06-04 ENCOUNTER — Ambulatory Visit (INDEPENDENT_AMBULATORY_CARE_PROVIDER_SITE_OTHER): Payer: No Typology Code available for payment source | Admitting: Family Medicine

## 2021-06-04 ENCOUNTER — Other Ambulatory Visit: Payer: Self-pay

## 2021-06-04 VITALS — BP 124/82 | HR 81 | Ht 65.0 in | Wt 199.0 lb

## 2021-06-04 DIAGNOSIS — Z7689 Persons encountering health services in other specified circumstances: Secondary | ICD-10-CM

## 2021-06-04 DIAGNOSIS — D509 Iron deficiency anemia, unspecified: Secondary | ICD-10-CM | POA: Diagnosis not present

## 2021-06-04 DIAGNOSIS — R7303 Prediabetes: Secondary | ICD-10-CM

## 2021-06-04 DIAGNOSIS — Z6832 Body mass index (BMI) 32.0-32.9, adult: Secondary | ICD-10-CM

## 2021-06-04 NOTE — Patient Instructions (Signed)

## 2021-06-04 NOTE — Progress Notes (Signed)
Date:  06/04/2021   Name:  Desiree Green   DOB:  01-29-1986   MRN:  [id number]   Chief Complaint: Establish Care  Patient is a 36 year old female who presents to establish care. The patient reports the following problems: weight gain and anxiety. Health maintenance has been reviewed and discussed.     Lab Results  Component Value Date   NA 137 09/15/2019   K 4.2 09/15/2019   CO2 25 09/15/2019   GLUCOSE 98 09/15/2019   BUN 10 09/15/2019   CREATININE 0.72 09/15/2019   CALCIUM 9.4 09/15/2019   GFRNONAA >60 09/15/2019   Lab Results  Component Value Date   CHOL 178 04/30/2021   HDL 46 04/30/2021   LDLCALC 95 04/30/2021   TRIG 215 (H) 04/30/2021   CHOLHDL 3.9 04/30/2021   Lab Results  Component Value Date   TSH 2.020 04/30/2021   Lab Results  Component Value Date   HGBA1C 5.8 (H) 04/30/2021   Lab Results  Component Value Date   WBC 5.3 09/15/2019   HGB 12.4 09/15/2019   HCT 39.6 09/15/2019   MCV 82.8 09/15/2019   PLT 236 09/15/2019   Lab Results  Component Value Date   ALT 15 06/09/2017   AST 21 06/09/2017   ALKPHOS 45 06/09/2017   BILITOT 0.9 06/09/2017   No results found for: 25OHVITD2, 25OHVITD3, VD25OH   Review of Systems  Constitutional:  Negative for chills and fever.  HENT:  Negative for drooling, ear discharge, ear pain and sore throat.   Respiratory:  Negative for cough, shortness of breath and wheezing.   Cardiovascular:  Negative for chest pain, palpitations and leg swelling.  Gastrointestinal:  Negative for abdominal pain, blood in stool, constipation, diarrhea and nausea.  Endocrine: Negative for polydipsia.  Genitourinary:  Negative for dysuria, frequency, hematuria and urgency.  Musculoskeletal:  Negative for back pain, myalgias and neck pain.  Skin:  Negative for rash.  Allergic/Immunologic: Negative for environmental allergies.  Neurological:  Negative for dizziness and headaches.  Hematological:  Does not bruise/bleed easily.   Psychiatric/Behavioral:  Negative for suicidal ideas. The patient is not nervous/anxious.    Patient Active Problem List   Diagnosis Date Noted   Menorrhagia with regular cycle 04/30/2021   Cervical cancer ([place]) 12/19/2019   Metrorrhagia 02/24/2018   Overweight (BMI 25.0-29.9) 02/24/2018   Headache 07/86/7544   Umbilical hernia 92/06/69   Disorder of sacrum 04/21/2012   Low back pain 04/21/2012    Allergies  Allergen Reactions   Benadryl [Diphenhydramine] Hives   Ibuprofen Hives   Lactose Intolerance (Gi) Diarrhea    Past Surgical History:  Procedure Laterality Date   DILATION AND CURETTAGE OF UTERUS     HERNIA REPAIR     LEEP  2007   TUBAL LIGATION      Social History   Tobacco Use   Smoking status: Former    Packs/day: 1.00    Years: 16.00    Pack years: 16.00    Types: Cigarettes    Quit date: 05/07/2018    Years since quitting: 3.0   Smokeless tobacco: Never   Tobacco comments:    started age 33.    Vaping Use   Vaping Use: Never used  Substance Use Topics   Alcohol use: Yes    Comment: rarely   Drug use: No     Medication list has been reviewed and updated.  Current Meds  Medication Sig   rizatriptan (MAXALT) 10 MG tablet  Take 1 tablet (10 mg total) by mouth as needed for migraine. May repeat in 2 hours if needed; this is a 30 day supply    PHQ 2/9 Scores 06/04/2021  PHQ - 2 Score 0  PHQ- 9 Score 8    GAD 7 : Generalized Anxiety Score 06/04/2021  Nervous, Anxious, on Edge 3  Control/stop worrying 3  Worry too much - different things 3  Trouble relaxing 3  Restless 2  Easily annoyed or irritable 2  Afraid - awful might happen 0  Total GAD 7 Score 16  Anxiety Difficulty Not difficult at all    BP Readings from Last 3 Encounters:  06/04/21 124/82  04/30/21 102/60  04/16/20 111/84    Physical Exam Vitals and nursing note reviewed.  Constitutional:      Appearance: She is well-developed.  HENT:     Head: Normocephalic.     Right  Ear: Tympanic membrane, ear canal and external ear normal.     Left Ear: Tympanic membrane, ear canal and external ear normal.     Nose: Nose normal. No congestion or rhinorrhea.  Eyes:     General: Lids are everted, no foreign bodies appreciated. No scleral icterus.       Left eye: No foreign body or hordeolum.     Conjunctiva/sclera: Conjunctivae normal.     Right eye: Right conjunctiva is not injected.     Left eye: Left conjunctiva is not injected.     Pupils: Pupils are equal, round, and reactive to light.  Neck:     Thyroid: No thyromegaly.     Vascular: No JVD.     Trachea: No tracheal deviation.  Cardiovascular:     Rate and Rhythm: Normal rate and regular rhythm.     Heart sounds: Normal heart sounds. No murmur heard.   No friction rub. No gallop.  Pulmonary:     Effort: Pulmonary effort is normal. No respiratory distress.     Breath sounds: Normal breath sounds. No stridor. No wheezing, rhonchi or rales.  Abdominal:     General: Bowel sounds are normal.     Palpations: Abdomen is soft. There is no mass.     Tenderness: There is no abdominal tenderness. There is no guarding or rebound.  Musculoskeletal:        General: No tenderness. Normal range of motion.     Cervical back: Normal range of motion and neck supple.  Lymphadenopathy:     Cervical: No cervical adenopathy.  Skin:    General: Skin is warm.     Findings: No rash.  Neurological:     Mental Status: She is alert and oriented to person, place, and time.     Cranial Nerves: No cranial nerve deficit.     Deep Tendon Reflexes: Reflexes normal.  Psychiatric:        Mood and Affect: Mood is not anxious or depressed.    Wt Readings from Last 3 Encounters:  06/04/21 199 lb (90.3 kg)  04/30/21 200 lb (90.7 kg)  04/16/20 185 lb (83.9 kg)    BP 124/82    Pulse 81    Ht 5\' 5"  (1.651 m)    Wt 199 lb (90.3 kg)    LMP 05/16/2021 (Approximate)    SpO2 99%    BMI 33.12 kg/m   Assessment and Plan:  1. Establishing  care with new doctor, encounter for Patient establishing care with new physician.  The only areas of concern below:  2. Microcytic  anemia Patient has a history of microcytic anemia that she was treated with iron and she is taking iron 55 mg along with vitamin C.  Patient has a history of menorrhagia and she may be undergoing an ablation given that she has had ongoing issues with this and this is probably contributing to her anemia.  Patient will be returning to her GYN for discussion of ablation and further discussion about problem #3.  3. BMI 32.0-32.9,adult Patient has had issues with weight gain which concerns her and which is contributing to her stress and overall wellbeing.  It has been discussed the patient needs to decrease to 1510 to 1200 cal a day.  She will proceed with limiting her calories and will discuss formal weight loss intervention with breast medications given that she is also prediabetic and may respond to some of the newer diabetic medications.  4.  Prediabetes patient last had a A1c of 5.8.  Patient has been given a dietary approach to proceed with this and will discuss this with her GYN with the idea that there may be medications in a formal weight loss program that she would respond very well with and would also have some improving effect on her A1c.

## 2021-06-06 ENCOUNTER — Ambulatory Visit: Payer: 59 | Admitting: Obstetrics & Gynecology

## 2021-06-08 ENCOUNTER — Telehealth: Payer: Self-pay | Admitting: Obstetrics & Gynecology

## 2021-06-08 NOTE — Telephone Encounter (Signed)
Patient's [facility] ablation has been rescheduled for 07/12/21 @ 8:55am, patient to arrive at 8:35am.

## 2021-06-08 NOTE — Telephone Encounter (Signed)
-----   Message from Georgana Curio sent at 05/23/2021 12:03 PM EST ----- Regarding: [facility] Patient is scheduled for Ablation tomorrow. Patient is requesting to rescheduled. Please advise

## 2021-06-13 ENCOUNTER — Encounter: Payer: Self-pay | Admitting: Obstetrics & Gynecology

## 2021-06-18 ENCOUNTER — Encounter: Payer: Self-pay | Admitting: Obstetrics & Gynecology

## 2021-06-18 NOTE — Telephone Encounter (Signed)
Patient is  needing to speak with Izora Gala about CPT codes for upcoming ablation. Please advise

## 2021-06-19 NOTE — Telephone Encounter (Signed)
Looks like Friday is ok

## 2021-06-19 NOTE — Telephone Encounter (Signed)
Sch tele appt where there is time.  Also see if she is still planning ablation on 07/12/21

## 2021-06-21 ENCOUNTER — Telehealth: Payer: Self-pay

## 2021-06-21 NOTE — Telephone Encounter (Signed)
Pt called and requested to change her appt date for in clinic [facility] ablation. The new DOS is 07/24/21.  She is asking for an estimate on CPT [phone] with [employer]. Izora Gala can you help me with that?

## 2021-06-24 ENCOUNTER — Other Ambulatory Visit: Payer: Self-pay

## 2021-06-24 ENCOUNTER — Ambulatory Visit (INDEPENDENT_AMBULATORY_CARE_PROVIDER_SITE_OTHER): Payer: No Typology Code available for payment source | Admitting: Obstetrics & Gynecology

## 2021-06-24 ENCOUNTER — Encounter: Payer: Self-pay | Admitting: Obstetrics & Gynecology

## 2021-06-24 VITALS — Ht 65.0 in | Wt 200.0 lb

## 2021-06-24 DIAGNOSIS — E669 Obesity, unspecified: Secondary | ICD-10-CM | POA: Diagnosis not present

## 2021-06-24 MED ORDER — OZEMPIC (0.25 OR 0.5 MG/DOSE) 2 MG/1.5ML ~~LOC~~ SOPN: 0.5000 mg | PEN_INJECTOR | SUBCUTANEOUS | 9 refills | Status: DC

## 2021-06-24 NOTE — Patient Instructions (Signed)
Semaglutide Injection °What is this medication? °SEMAGLUTIDE (SEM a GLOO tide) treats type 2 diabetes. It works by increasing insulin levels in your body, which decreases your blood sugar (glucose). It also reduces the amount of sugar released into the blood and slows down your digestion. It can also be used to lower the risk of heart attack and stroke in people with type 2 diabetes. Changes to diet and exercise are often combined with this medication. °This medicine may be used for other purposes; ask your health care provider or pharmacist if you have questions. °COMMON BRAND NAME(S): OZEMPIC °What should I tell my care team before I take this medication? °They need to know if you have any of these conditions: °Endocrine tumors (MEN 2) or if someone in your family had these tumors °Eye disease, vision problems °History of pancreatitis °Kidney disease °Stomach problems °Thyroid cancer or if someone in your family had thyroid cancer °An unusual or allergic reaction to semaglutide, other medications, foods, dyes, or preservatives °Pregnant or trying to get pregnant °Breast-feeding °How should I use this medication? °This medication is for injection under the skin of your upper leg (thigh), stomach area, or upper arm. It is given once every week (every 7 days). You will be taught how to prepare and give this medication. Use exactly as directed. Take your medication at regular intervals. Do not take it more often than directed. °If you use this medication with insulin, you should inject this medication and the insulin separately. Do not mix them together. Do not give the injections right next to each other. Change (rotate) injection sites with each injection. °It is important that you put your used needles and syringes in a special sharps container. Do not put them in a trash can. If you do not have a sharps container, call your pharmacist or care team to get one. °A special MedGuide will be given to you by the  pharmacist with each prescription and refill. Be sure to read this information carefully each time. °This medication comes with INSTRUCTIONS FOR USE. Ask your pharmacist for directions on how to use this medication. Read the information carefully. Talk to your pharmacist or care team if you have questions. °Talk to your care team about the use of this medication in children. Special care may be needed. °Overdosage: If you think you have taken too much of this medicine contact a poison control center or emergency room at once. °NOTE: This medicine is only for you. Do not share this medicine with others. °What if I miss a dose? °If you miss a dose, take it as soon as you can within 5 days after the missed dose. Then take your next dose at your regular weekly time. If it has been longer than 5 days after the missed dose, do not take the missed dose. Take the next dose at your regular time. Do not take double or extra doses. If you have questions about a missed dose, contact your care team for advice. °What may interact with this medication? °Other medications for diabetes °Many medications may cause changes in blood sugar, these include: °Alcohol containing beverages °Antiviral medications for HIV or AIDS °Aspirin and aspirin-like medications °Certain medications for blood pressure, heart disease, irregular heart beat °Chromium °Diuretics °Female hormones, such as estrogens or progestins, birth control pills °Fenofibrate °Gemfibrozil °Isoniazid °Lanreotide °Female hormones or anabolic steroids °MAOIs like Carbex, Eldepryl, Marplan, Nardil, and Parnate °Medications for weight loss °Medications for allergies, asthma, cold, or cough °Medications for depression,   anxiety, or psychotic disturbances °Niacin °Nicotine °NSAIDs, medications for pain and inflammation, like ibuprofen or naproxen °Octreotide °Pasireotide °Pentamidine °Phenytoin °Probenecid °Quinolone antibiotics such as ciprofloxacin, levofloxacin, ofloxacin °Some  herbal dietary supplements °Steroid medications such as prednisone or cortisone °Sulfamethoxazole; trimethoprim °Thyroid hormones °Some medications can hide the warning symptoms of low blood sugar (hypoglycemia). You may need to monitor your blood sugar more closely if you are taking one of these medications. These include: °Beta-blockers, often used for high blood pressure or heart problems (examples include atenolol, metoprolol, propranolol) °Clonidine °Guanethidine °Reserpine °This list may not describe all possible interactions. Give your health care provider a list of all the medicines, herbs, non-prescription drugs, or dietary supplements you use. Also tell them if you smoke, drink alcohol, or use illegal drugs. Some items may interact with your medicine. °What should I watch for while using this medication? °Visit your care team for regular checks on your progress. °Drink plenty of fluids while taking this medication. Check with your care team if you get an attack of severe diarrhea, nausea, and vomiting. The loss of too much body fluid can make it dangerous for you to take this medication. °A test called the HbA1C (A1C) will be monitored. This is a simple blood test. It measures your blood sugar control over the last 2 to 3 months. You will receive this test every 3 to 6 months. °Learn how to check your blood sugar. Learn the symptoms of low and high blood sugar and how to manage them. °Always carry a quick-source of sugar with you in case you have symptoms of low blood sugar. Examples include hard sugar candy or glucose tablets. Make sure others know that you can choke if you eat or drink when you develop serious symptoms of low blood sugar, such as seizures or unconsciousness. They must get medical help at once. °Tell your care team if you have high blood sugar. You might need to change the dose of your medication. If you are sick or exercising more than usual, you might need to change the dose of your  medication. °Do not skip meals. Ask your care team if you should avoid alcohol. Many nonprescription cough and cold products contain sugar or alcohol. These can affect blood sugar. °Pens should never be shared. Even if the needle is changed, sharing may result in passing of viruses like hepatitis or HIV. °Wear a medical ID bracelet or chain, and carry a card that describes your disease and details of your medication and dosage times. °Do not become pregnant while taking this medication. Women should inform their care team if they wish to become pregnant or think they might be pregnant. There is a potential for serious side effects to an unborn child. Talk to your care team for more information. °What side effects may I notice from receiving this medication? °Side effects that you should report to your care team as soon as possible: °Allergic reactions--skin rash, itching, hives, swelling of the face, lips, tongue, or throat °Change in vision °Dehydration--increased thirst, dry mouth, feeling faint or lightheaded, headache, dark yellow or brown urine °Gallbladder problems--severe stomach pain, nausea, vomiting, fever °Heart palpitations--rapid, pounding, or irregular heartbeat °Kidney injury--decrease in the amount of urine, swelling of the ankles, hands, or feet °Pancreatitis--severe stomach pain that spreads to your back or gets worse after eating or when touched, fever, nausea, vomiting °Thyroid cancer--new mass or lump in the neck, pain or trouble swallowing, trouble breathing, hoarseness °Side effects that usually do not require medical   attention (report to your care team if they continue or are bothersome): °Diarrhea °Loss of appetite °Nausea °Stomach pain °Vomiting °This list may not describe all possible side effects. Call your doctor for medical advice about side effects. You may report side effects to FDA at 1-800-FDA-1088. °Where should I keep my medication? °Keep out of the reach of children. °Store  unopened pens in a refrigerator between 2 and 8 degrees C (36 and 46 degrees F). Do not freeze. Protect from light and heat. After you first use the pen, it can be stored for 56 days at room temperature between 15 and 30 degrees C (59 and 86 degrees F) or in a refrigerator. Throw away your used pen after 56 days or after the expiration date, whichever comes first. °Do not store your pen with the needle attached. If the needle is left on, medication may leak from the pen. °NOTE: This sheet is a summary. It may not cover all possible information. If you have questions about this medicine, talk to your doctor, pharmacist, or health care provider. °© 2022 [employer] (2020-08-23 00:00:00) ° °

## 2021-06-24 NOTE — Progress Notes (Signed)
Virtual Visit via Telephone Note  I connected with Desiree Green on 06/24/21 at  4:15 PM EST by telephone and verified that I am speaking with the correct person using two identifiers.  Location: Patient: Home Provider: Office   I discussed the limitations, risks, security and privacy concerns of performing an evaluation and management service by telephone and the availability of in person appointments. I also discussed with the patient that there may be a patient responsible charge related to this service. The patient expressed understanding and agreed to proceed.   History of Present Illness:  HPI:  Patient is a 36 y.o. [id number] presenting for evaluation of abnormal weight gain.   Unable to lose weight and sustain it on her own..  She feels she has gained weight due to problems with her nutrition and physical activity.  She has these associated symptoms: none.  She has tried self-directed dieting, supervised diet program, very low calorie diet, and Weight Watchers in the past with little success. PMHx: She  has a past medical history of Cervical dysplasia (2007), Colitis, and Migraine. Also,  has a past surgical history that includes Dilation and curettage of uterus; Hernia repair; Tubal ligation; and LEEP (2007)., family history includes Breast cancer in her maternal aunt; Colon cancer in her paternal grandfather; Hypertension in her father; Lung cancer in her paternal grandfather; Melanoma in her maternal grandmother; Ovarian cancer in her sister; Thyroid disease in her mother.,  reports that she quit smoking about 3 years ago. Her smoking use included cigarettes. She has a 16.00 pack-year smoking history. She has never used smokeless tobacco. She reports current alcohol use. She reports that she does not use drugs.  She has a current medication list which includes the following prescription(s): rizatriptan, ozempic (0.25 or 0.5 mg/dose), misoprostol, promethazine, and rizatriptan. Also, is  allergic to benadryl [diphenhydramine], ibuprofen, and lactose intolerance (gi).  Review of Systems  All other systems reviewed and are negative.   Observations/Objective: No exam today, due to telephone eVisit due to [facility] virus restriction on elective visits and procedures.  Prior visits reviewed along with ultrasounds/labs as indicated. Home weight today 200 lbs    (Also was weight 2 mos ago at office visit) HgbA1C 5.8 (04/30/21)  Assessment and Plan:   ICD-10-CM   1. Obesity (BMI 30-39.9)  E66.9      Plan: Will assist patient in incorporating positive experiences into her life to promote a positive mental attitude.  Education given regarding appropriate lifestyle changes for weight loss, including regular physical activity, healthy coping strategies, caloric restriction, and healthy eating patterns.  Patient is started on Ozempic weekly injections.    The risks and benefits as well as side effects of medication, such as Ozempic, Phenteramine or Tenuate, is discussed.  The pros and cons of suppressing appetite and boosting metabolism is counseled.  Risks of tolerance and addiction discussed.  Use of medicine will be short term.  Pt to call with any negative side effects and agrees to keep follow up appointments.   Follow Up Instructions: 2 mos   I discussed the assessment and treatment plan with the patient. The patient was provided an opportunity to ask questions and all were answered. The patient agreed with the plan and demonstrated an understanding of the instructions.   The patient was advised to call back or seek an in-person evaluation if the symptoms worsen or if the condition fails to improve as anticipated.  I provided 15 minutes of non-face-to-face time during this  encounter.   Hoyt Koch, MD

## 2021-06-25 ENCOUNTER — Telehealth: Payer: Self-pay

## 2021-06-25 NOTE — Telephone Encounter (Signed)
Pt calling; pharmacy is waiting on PA for Ozempic; feel free to call pt.  [phone]

## 2021-06-25 NOTE — Telephone Encounter (Signed)
My Chart message sent

## 2021-06-25 NOTE — Telephone Encounter (Signed)
PA submitted waiting on response. Called pt to advise, no answer. Desiree Green cma

## 2021-06-28 ENCOUNTER — Other Ambulatory Visit: Payer: Self-pay

## 2021-06-28 ENCOUNTER — Other Ambulatory Visit: Payer: Self-pay | Admitting: Obstetrics & Gynecology

## 2021-06-28 MED ORDER — WEGOVY 0.25 MG/0.5ML ~~LOC~~ SOAJ
0.5000 mg | SUBCUTANEOUS | 6 refills | Status: DC
  Filled 2021-06-28 – 2021-07-16 (×8): qty 2, 28d supply, fill #0
  Filled 2021-07-29 – 2021-08-02 (×3): qty 2, 28d supply, fill #1

## 2021-06-28 MED ORDER — OZEMPIC (0.25 OR 0.5 MG/DOSE) 2 MG/1.5ML ~~LOC~~ SOPN
0.5000 mg | PEN_INJECTOR | SUBCUTANEOUS | 9 refills | Status: DC
  Filled 2021-06-28: qty 1.5, 28d supply, fill #0

## 2021-07-01 ENCOUNTER — Other Ambulatory Visit: Payer: Self-pay

## 2021-07-02 NOTE — Telephone Encounter (Signed)
OK I resubmitted the PA with the correct diagnoses and prior meds

## 2021-07-02 NOTE — Telephone Encounter (Signed)
Pt calling; states pharm is still waiting on PA; CMA said it was denied; pt's ins told her they haven't recv'd anything for wegovy.  Please call.  [phone]

## 2021-07-02 NOTE — Telephone Encounter (Signed)
This is why it was denied This request has not been approved. Based on the information submitted for review, you did not meet our guideline rules for the requested drug. In order for your request to be approved, your provider would need to show that you have met the guideline rules below. The details below are written in medical language. If you have questions, please contact your provider. In some cases, the requested medication or alternatives offered may have additional approval requirements. Your provider requested Ozempic to manage your condition of type 2 diabetes mellitus (a chronic condition in which your body is resistant to insulin, a hormone that regulates your blood sugar).For the treatment of type 2 diabetes mellitus, our guideline named GLP-1 STEP OVERRIDE (reviewed for Ozempic) requires that you have tried taking metformin, a metformin combination product, a covered sulfonylurea (such as glimepiride, glipizide, or glyburide), a sulfonylurea combination product, pioglitazone OR a pioglitazone combination product, unless there is a medical reason why you cannot (contraindication to).This request was denied because we did not receive information that you meet the requirement listed above. Please work with your doctor to use a different medication or get [place] more information if it will allow [place] to approve this request. A written notification letter will follow with additional details.

## 2021-07-02 NOTE — Telephone Encounter (Signed)
Patient transferred to me from front desk. She had confusion on whether the PA was submitted for [facility] She has been getting mixed information. We state PA was submitted, but insurance and pharmacy doesn't show they have received anything. I accessed Cover My Meds and verified that [facility] PA was submitted on 06/25/2021. While viewing the submission I see that it was denied/needs an E-Appeal. Here is the information from Cover My Meds:  Desiree Green (Key: [facility]) Rx #: [id number] GEZMOQ 0.5MG /0.5ML auto-injectors   Form Med[employer]act ePA Form 2017 NCPDP Created 4 days ago Sent to Plan 4 days ago Plan Response 4 days ago Submit Clinical Questions Determination Message from Plan A previously denied or partially denied PA request has been located for this member. To initiate an appeal or reconsideration please call [phone]. Thank you.  Advised patient message/information will be sent to Dr. Kenton Kingfisher.  He is ON CALL today, off tomorrow, will return to office Thursday. Someone will contact her with next steps.

## 2021-07-03 ENCOUNTER — Other Ambulatory Visit: Payer: Self-pay

## 2021-07-05 ENCOUNTER — Other Ambulatory Visit: Payer: Self-pay

## 2021-07-12 ENCOUNTER — Ambulatory Visit: Payer: Self-pay | Admitting: Obstetrics & Gynecology

## 2021-07-12 ENCOUNTER — Other Ambulatory Visit: Payer: Self-pay

## 2021-07-15 ENCOUNTER — Other Ambulatory Visit: Payer: Self-pay

## 2021-07-16 ENCOUNTER — Other Ambulatory Visit: Payer: Self-pay

## 2021-07-16 ENCOUNTER — Telehealth: Payer: Self-pay

## 2021-07-16 NOTE — Telephone Encounter (Signed)
Spoke c Ashely; she will change it to the 0.25mg  starter dose.

## 2021-07-16 NOTE — Telephone Encounter (Signed)
Christy calling from [employer]; asked pt if she had gotten the starter dose of Wegovy of 0.25mg  instead of 0.5mg  to reduce GI sxs; pt advised she did not have the starter dose; would we be willing to start pt with the starter dose of 0.25mg ?  [phone]

## 2021-07-16 NOTE — Telephone Encounter (Signed)
If they have it , yes.  Worries about backorder at most pharmacies

## 2021-07-18 ENCOUNTER — Other Ambulatory Visit: Payer: Self-pay

## 2021-07-22 ENCOUNTER — Telehealth: Payer: Self-pay

## 2021-07-22 NOTE — Telephone Encounter (Signed)
Called pt to reschedule procedure with RPH. Riner but also went ahead and scheduled her for 08/27/21 @ 9:55

## 2021-07-23 ENCOUNTER — Telehealth: Payer: Self-pay

## 2021-07-23 NOTE — Telephone Encounter (Signed)
Not god time, needs to be week of 3 14 or so

## 2021-07-23 NOTE — Telephone Encounter (Signed)
Contacted [employer] and spoke to Dominica to receive prior auth. No prior Desiree Green is required for CPT [phone] when done in the provider's office. Call ref# [id number]

## 2021-07-23 NOTE — Telephone Encounter (Signed)
Called [employer] for prior auth for CPT [id number]  Spoke to Shelby and she informed that no prior Josem Kaufmann is needed for CPT [phone] when preformed in the doctors office.  Call ref # [id number]

## 2021-07-23 NOTE — Telephone Encounter (Signed)
Pt rtn'd call confirming the change to her in clinic ablation appt with RPH. His first available time was 08/27/21 @ 9:55. She mentioned that he said that it was better to be timed with her cycle. She indicated her LMP day one was 2/6 or 2/7.  [place] - will 3/28 be ok based on her cycle timing?

## 2021-07-24 ENCOUNTER — Ambulatory Visit: Payer: Self-pay | Admitting: Obstetrics & Gynecology

## 2021-07-29 ENCOUNTER — Other Ambulatory Visit: Payer: Self-pay

## 2021-08-02 ENCOUNTER — Encounter: Payer: Self-pay | Admitting: Obstetrics & Gynecology

## 2021-08-02 ENCOUNTER — Other Ambulatory Visit: Payer: Self-pay

## 2021-08-05 ENCOUNTER — Other Ambulatory Visit: Payer: Self-pay | Admitting: Obstetrics & Gynecology

## 2021-08-05 ENCOUNTER — Other Ambulatory Visit: Payer: Self-pay

## 2021-08-05 MED ORDER — WEGOVY 0.5 MG/0.5ML ~~LOC~~ SOAJ
0.5000 mg | SUBCUTANEOUS | 6 refills | Status: DC
Start: 2021-08-05 — End: 2022-01-28
  Filled 2021-08-05: qty 2, 28d supply, fill #0
  Filled 2021-09-02 – 2021-10-18 (×5): qty 2, 28d supply, fill #1

## 2021-08-06 ENCOUNTER — Ambulatory Visit
Admission: RE | Admit: 2021-08-06 | Discharge: 2021-08-06 | Disposition: A | Discharge status: Home / Self Care | Payer: No Typology Code available for payment source | Source: Ambulatory Visit | Attending: Family Medicine | Admitting: Family Medicine

## 2021-08-06 ENCOUNTER — Ambulatory Visit
Admission: RE | Admit: 2021-08-06 | Discharge: 2021-08-06 | Disposition: A | Discharge status: Home / Self Care | Payer: No Typology Code available for payment source | Attending: Family Medicine | Admitting: Family Medicine

## 2021-08-06 ENCOUNTER — Other Ambulatory Visit: Payer: Self-pay

## 2021-08-06 ENCOUNTER — Encounter: Payer: Self-pay | Admitting: Family Medicine

## 2021-08-06 ENCOUNTER — Ambulatory Visit (INDEPENDENT_AMBULATORY_CARE_PROVIDER_SITE_OTHER): Payer: No Typology Code available for payment source | Admitting: Family Medicine

## 2021-08-06 VITALS — BP 122/84 | HR 78 | Ht 65.0 in | Wt 192.0 lb

## 2021-08-06 DIAGNOSIS — M79661 Pain in right lower leg: Secondary | ICD-10-CM

## 2021-08-06 NOTE — Progress Notes (Signed)
? ? ?Date:  08/06/2021  ? ?Name:  Desiree Green   DOB:  05-Dec-1985   MRN:  [id number] ? ? ?Chief Complaint: Ankle Pain (Right lower leg, hurts to walk- bruise feeling and toes are going numb) ? ?Ankle Pain  ?The incident occurred more than 1 week ago (2-3 weeks). There was no injury mechanism. The pain is present in the right ankle (lower leg). The quality of the pain is described as aching. The pain is at a severity of 9/10. The pain is moderate. The pain has been Constant since onset. Associated symptoms include an inability to bear weight, a loss of motion, numbness and tingling. The symptoms are aggravated by palpation, movement and weight bearing. She has tried NSAIDs for the symptoms. The treatment provided no relief.  ? ?Lab Results  ?Component Value Date  ? NA 137 09/15/2019  ? K 4.2 09/15/2019  ? CO2 25 09/15/2019  ? GLUCOSE 98 09/15/2019  ? BUN 10 09/15/2019  ? CREATININE 0.72 09/15/2019  ? CALCIUM 9.4 09/15/2019  ? GFRNONAA >60 09/15/2019  ? ?Lab Results  ?Component Value Date  ? CHOL 178 04/30/2021  ? HDL 46 04/30/2021  ? [place] 95 04/30/2021  ? TRIG 215 (H) 04/30/2021  ? CHOLHDL 3.9 04/30/2021  ? ?Lab Results  ?Component Value Date  ? TSH 2.020 04/30/2021  ? ?Lab Results  ?Component Value Date  ? HGBA1C 5.8 (H) 04/30/2021  ? ?Lab Results  ?Component Value Date  ? WBC 5.3 09/15/2019  ? HGB 12.4 09/15/2019  ? HCT 39.6 09/15/2019  ? MCV 82.8 09/15/2019  ? PLT 236 09/15/2019  ? ?Lab Results  ?Component Value Date  ? ALT 15 06/09/2017  ? AST 21 06/09/2017  ? ALKPHOS 45 06/09/2017  ? BILITOT 0.9 06/09/2017  ? ?No results found for: 25OHVITD2, [place], VD25OH  ? ?Review of Systems  ?Respiratory:  Negative for chest tightness, shortness of breath and wheezing.   ?Cardiovascular:  Negative for chest pain and leg swelling.  ?Gastrointestinal:  Negative for abdominal pain.  ?Neurological:  Positive for tingling and numbness.  ? ?Patient Active Problem List  ? Diagnosis Date Noted  ? Obesity (BMI 30-39.9)  06/24/2021  ? Menorrhagia with regular cycle 04/30/2021  ? Cervical cancer ([place]) 12/19/2019  ? Metrorrhagia 02/24/2018  ? Overweight (BMI 25.0-29.9) 02/24/2018  ? Headache 06/22/2017  ? Umbilical hernia 69/62/9528  ? Disorder of sacrum 04/21/2012  ? Low back pain 04/21/2012  ? ? ?Allergies  ?Allergen Reactions  ? Benadryl [Diphenhydramine] Hives  ? Ibuprofen Hives  ? Lactose Intolerance (Gi) Diarrhea  ? ? ?Past Surgical History:  ?Procedure Laterality Date  ? DILATION AND CURETTAGE OF UTERUS    ? HERNIA REPAIR    ? LEEP  2007  ? TUBAL LIGATION    ? ? ?Social History  ? ?Tobacco Use  ? Smoking status: Former  ?  Packs/day: 1.00  ?  Years: 16.00  ?  Pack years: 16.00  ?  Types: Cigarettes  ?  Quit date: 05/07/2018  ?  Years since quitting: 3.2  ? Smokeless tobacco: Never  ? Tobacco comments:  ?  started age 36.    ?Vaping Use  ? Vaping Use: Never used  ?Substance Use Topics  ? Alcohol use: Yes  ?  Comment: rarely  ? Drug use: No  ? ? ? ?Medication list has been reviewed and updated. ? ?Current Meds  ?Medication Sig  ? rizatriptan (MAXALT) 10 MG tablet Take 1 tablet (10 mg total)  by mouth as needed for migraine. May repeat in 2 hours if needed; this is a 30 day supply  ? rizatriptan (MAXALT) 10 MG tablet Take 10 mg by mouth as needed for migraine. May repeat in 2 hours if needed  ? Semaglutide-Weight Management (WEGOVY) 0.5 MG/0.5ML SOAJ Inject 0.5 mg into the skin once a week.  ? ? ?PHQ 2/9 Scores 06/04/2021  ?PHQ - 2 Score 0  ?PHQ- 9 Score 8  ? ? ?GAD 7 : Generalized Anxiety Score 06/04/2021  ?Nervous, Anxious, on Edge 3  ?Control/stop worrying 3  ?Worry too much - different things 3  ?Trouble relaxing 3  ?Restless 2  ?Easily annoyed or irritable 2  ?Afraid - awful might happen 0  ?Total GAD 7 Score 16  ?Anxiety Difficulty Not difficult at all  ? ? ?BP Readings from Last 3 Encounters:  ?08/06/21 122/84  ?06/04/21 124/82  ?04/30/21 102/60  ? ? ?Physical Exam ?HENT:  ?   Right Ear: Tympanic membrane, ear canal and external  ear normal.  ?   Left Ear: Tympanic membrane and ear canal normal.  ?   Nose: Nose normal.  ?Cardiovascular:  ?   Heart sounds: No murmur heard. ?  No gallop.  ?Pulmonary:  ?   Breath sounds: No wheezing or rhonchi.  ?Skin: ?   Findings: No rash.  ? ? ?Wt Readings from Last 3 Encounters:  ?08/06/21 192 lb (87.1 kg)  ?06/24/21 200 lb (90.7 kg)  ?06/04/21 199 lb (90.3 kg)  ? ? ?BP 122/84   Pulse 78   Ht '5\' 5"'$  (1.651 m)   Wt 192 lb (87.1 kg)   SpO2 98%   BMI 31.95 kg/m?  ? ?Assessment and Plan: ? ?1. Pain of right lower leg ?New onset.  Persistent.  Stable.  Onset 2 weeks ago with persistent discomfort and exquisite tenderness over a 3 x 5 cm aspect of her right lower leg.  Pain is noted even when the razor touches the area.  There is tenderness to light touch but not to deep palpation to the lateral aspect of the area of concern.  Not so sure that this is musculoskeletal in nature but more neurologic.  It also may represent a residual thrombophlebitis in this area as well there is no motor abnormality except initially there was some discomfort with dorsiflexion of the foot.  Patient will continue to take Aleve that she is already taking and we will obtain an x-ray of the lower tibia aspect and refer to sports medicine for further eval. ?- DG Tibia/Fibula Right; Future  ? ? ?

## 2021-08-06 NOTE — Patient Instructions (Signed)
Thrombophlebitis ?Thrombophlebitis is a condition in which a blood clot and inflammation occur inside a vein. This can happen in the arms, legs, or torso. Thrombophlebitis may involve superficial veins, deep veins, or both. Superficial veins are close to the surface of the body and are part of the superficial venous system. Veins that are deeper inside the body are part of the deep venous system. ?When this condition happens in a superficial vein (superficial thrombophlebitis), it is usually not serious.However, when the condition happens in a vein that is part of the deep venous system (deep vein thrombosis, DVT), it can cause serious problems. ?What are the causes? ?This condition may be caused by: ?Infection, injury, or trauma to a vein. ?Inflammation of the veins. ?Medical conditions that can cause blood to clot more easily (hypercoagulable state). ?Backing up, or reflux, of blood flow through the veins (chronic venous insufficiency or venous stasis). ?What increases the risk? ?The following factors may make you more likely to develop this condition: ?Having a long, thin tube (catheter) put in a vein, such as a central line, port, or IV catheter. ?Getting certain medicines through a catheter that can irritate the vein. ?Pregnancy or having recently given birth. ?Cancer. ?Obesity. ?Taking oral contraceptive pills (OCPs) or hormone therapy (HT) medicines. ?Spasms of veins. ?Immobilization, or not moving the limbs for prolonged periods. ?What are the signs or symptoms? ?The main symptoms of this condition are: ?Swelling and pain in an arm or leg. If the affected vein is in the leg, you may feel pain while standing or walking. ?Warmth or redness in an arm or leg. ?Tenderness in the affected area when it is touched. ?Other symptoms include: ?Low-grade fever. ?Muscle aches. ?A bulging or hard vein (venous distension). ?In some cases, there are no symptoms. ?How is this diagnosed? ?This condition may be diagnosed based  on: ?Your symptoms and medical history. ?A physical exam. ?Tests, such as a test that uses sound waves to make images (duplex ultrasound). ?How is this treated? ?Treatment depends on how severe the condition is and which area of the body is affected. Treatment may include: ?Applying a warm compress or heating pad to affected areas. This may need to be repeated several times a day. ?Moving the affected limb. For example, you may need to start doing walking exercises right away. You will also be encouraged to continue your usual daily activities. ?Raising (elevating) the affected arm or leg above the level of your heart. ?Medicines, such as: ?Anti-inflammatory medicines, such as ibuprofen. ?Blood thinners (anticoagulants) or anti-platelet drugs such as aspirin. ?Removing an IV or central line that may be causing the problem. ?Wearing compression stockings to help prevent blood clots and reduce swelling in your legs. ?Follow these instructions at home: ?Medicines ?Take over-the-counter and prescription medicines only as told by your health care provider. ?If you are taking blood thinners: ?Talk with your health care provider before you take any medicines that contain aspirin or NSAIDs, such as ibuprofen. These medicines increase your risk for dangerous bleeding. ?Take your medicine exactly as told, at the same time every day. ?Avoid activities that could cause injury or bruising, and follow instructions about how to prevent falls. ?Wear a medical alert bracelet or carry a card that lists what medicines you take. ?Managing pain, stiffness, and swelling ? ?If directed, apply heat to the affected area as often as told by your health care provider. Use the heat source that your health care provider recommends, such as a moist heat pack or  a heating pad. ?Place a towel between your skin and the heat source. ?Leave the heat on for 20-30 minutes. ?Remove the heat if your skin turns bright red. This is especially important if  you are unable to feel pain, heat, or cold. You have a greater risk of getting burned. ?Elevate the affected area above the level of your heart while you are sitting or lying down. ?Activity ?Return to your normal activities as told by your health care provider. Ask your health care provider what activities are safe for you. ?Avoid sitting for a long time without moving. Get up to take short walks every 1-2 hours. This is important to improve blood flow and breathing. Ask for help if you feel weak or unsteady. ?Do exercises as told by your health care provider. ?Rest as told by your health care provider. ?General instructions ?Drink enough fluid to keep your urine pale yellow. ?Wear compression stockings as told by your health care provider. ?Do not use any products that contain nicotine or tobacco. These products include cigarettes, chewing tobacco, and vaping devices, such as e-cigarettes. If you need help quitting, ask your health care provider. ?Keep all follow-up visits. This is important. ?Contact a health care provider if: ?You miss a dose of your blood thinner, if applicable. ?Your symptoms do not improve. ?You have unusual bruising. ?You have nausea, vomiting, or diarrhea that lasts for more than a day. ?Get help right away if: ?You are breathing fast or have chest pain. ?You have blood in your vomit, urine, or stool. ?You have severe pain in your affected arm or leg or new pain in any arm or leg. ?You have light-headedness, dizziness, a severe headache, or confusion. ?These symptoms may represent a serious problem that is an emergency. Do not wait to see if the symptoms will go away. Get medical help right away. Call your local emergency services (911 in the [place] Do not drive yourself to the hospital. ?Summary ?Thrombophlebitis is a condition in which a blood clot forms in a vein, causing inflammation and often pain. This can happen in both superficial and deep veins. ?The main symptom of this condition  is swelling and pain around the affected vein. Tenderness and redness may also be present. ?Treatment may include warm compresses, compression stockings, anti-inflammatory medicines, or blood thinners. ?Make sure you take all medicines, especially blood thinners, as instructed and keep all follow-up visits to ensure proper healing of the vein. ?This information is not intended to replace advice given to you by your health care provider. Make sure you discuss any questions you have with your health care provider. ?Document Revised: 11/12/2020 Document Reviewed: 11/12/2020 ?Elsevier Patient Education ? [place]. ? ?

## 2021-08-08 ENCOUNTER — Encounter: Payer: Self-pay | Admitting: Family Medicine

## 2021-08-08 ENCOUNTER — Other Ambulatory Visit: Payer: Self-pay

## 2021-08-08 ENCOUNTER — Ambulatory Visit (INDEPENDENT_AMBULATORY_CARE_PROVIDER_SITE_OTHER): Payer: No Typology Code available for payment source | Admitting: Family Medicine

## 2021-08-08 VITALS — Ht 65.0 in | Wt 199.0 lb

## 2021-08-08 DIAGNOSIS — M659 Synovitis and tenosynovitis, unspecified: Secondary | ICD-10-CM

## 2021-08-08 MED ORDER — DICLOFENAC SODIUM 75 MG PO TBEC
75.0000 mg | DELAYED_RELEASE_TABLET | Freq: Two times a day (BID) | ORAL | 0 refills | Status: DC
  Filled 2021-08-08: qty 30, 15d supply, fill #0

## 2021-08-08 NOTE — Patient Instructions (Signed)
-   Use ankle compression wrap at all times as demonstrated, okay to adjust for comfort/circulation, remove for bathing/bedtime ?- Start diclofenac twice daily, take with food ?- If any reactions to diclofenac noted similar to your ibuprofen reaction, stop immediately and contact our office for next steps ?- Hold from any activity beyond simple daily tasks x2 weeks ?- Start home exercises next week and focus on slow and steady progress ?- Contact our office in 2 weeks if still symptomatic without adequate progress ?- Otherwise, at 2 weeks rash return to normal activity and follow-up as needed ?

## 2021-08-08 NOTE — Assessment & Plan Note (Signed)
Patient presents with atraumatic right anteromedial lower leg/ankle pain.  Denies any change in activity preceding these events, does have a history of recurrent lateral ankle pain and swelling but this has been relatively asymptomatic over the past few months.  Pain associated with swelling, weightbearing, extremely sensitive to the touch, discomfort even while attempting to shave the ankle. ? ?Examination reveals full range of motion of the ankle, there is pain with resisted dorsiflexion, there is subtle swelling along the anteromedial ankle and a discrete region of focal acute tenderness that matches the distribution of the extensor hallucis longus musculotendinous junction.  Her radiographs are essentially benign. ? ?I have advised patient on various treatment strategies, we will start with compressive wrap around the midfoot to the lower third of the ankle that she will wear consistently for 2 weeks, activity restrictions, scheduled diclofenac was prescribed for 2-week course, and a home-based rehab program was provided for her to begin next week.  She is to contact us for any suboptimal progress at 2 weeks, if noted anticipate transition to cam boot, can also consider local corticosteroid injections around the tendon under ultrasound guidance. ? ?Acute issue, independent interpretation x-ray, Rx management ?

## 2021-08-09 NOTE — Progress Notes (Signed)
?  ? ?  Primary Care / Sports Medicine Office Visit ? ?Patient Information:  ?Patient ID: Desiree Green, female DOB: 01-17-1986 Age: 36 y.o. MRN: [id number]  ? ?Desiree Green is a pleasant 36 y.o. female presenting with the following: ? ?Chief Complaint  ?Patient presents with  ? Leg Pain  ?  X2-3 weeks,Right left, Lower leg  ?  ? ? ?There were no vitals filed for this visit. ?Vitals:  ? 08/08/21 1135  ?Weight: 199 lb (90.3 kg)  ?Height: '5\' 5"'$  (1.651 m)  ? ?Body mass index is 33.12 kg/m?. ? ?DG Tibia/Fibula Right ? ?Result Date: 08/06/2021 ?CLINICAL DATA:  Right lower leg pain for 2-3 weeks with no apparent injury. Patient reports pain increases when weight-bearing on the lower third portion of shin. EXAM: RIGHT TIBIA AND FIBULA - 2 VIEW COMPARISON:  None. FINDINGS: Normal bone mineralization. Tiny chronic well corticated ossicle just distal to the fibula. No acute fracture or dislocation. IMPRESSION: No significant abnormality within the tibia or fibula. Well corticated ossicle just distal to the fibula, likely the sequela of remote trauma. Electronically Signed   By: Yvonne Kendall M.D.   On: 08/06/2021 21:13    ? ?Independent interpretation of notes and tests performed by another provider:  ? ?Independent interpretation right tib-fib x-ray reveals no significant degenerative changes about the knee, there is a well circumscribed ossicle about the lateral malleolus that may represent a sequelae of old trauma/unhealed fracture, subtle radiolucency surrounding the distal third of the tibial shaft, not consistent with acute osseous injury ? ?Procedures performed:  ? ?None ? ?Pertinent History, Exam, Impression, and Recommendations:  ? ?Tenosynovitis of extensor hallucis longus tendon ?Patient presents with atraumatic right anteromedial lower leg/ankle pain.  Denies any change in activity preceding these events, does have a history of recurrent lateral ankle pain and swelling but this has been relatively  asymptomatic over the past few months.  Pain associated with swelling, weightbearing, extremely sensitive to the touch, discomfort even while attempting to shave the ankle. ? ?Examination reveals full range of motion of the ankle, there is pain with resisted dorsiflexion, there is subtle swelling along the anteromedial ankle and a discrete region of focal acute tenderness that matches the distribution of the extensor hallucis longus musculotendinous junction.  Her radiographs are essentially benign. ? ?I have advised patient on various treatment strategies, we will start with compressive wrap around the midfoot to the lower third of the ankle that she will wear consistently for 2 weeks, activity restrictions, scheduled diclofenac was prescribed for 2-week course, and a home-based rehab program was provided for her to begin next week.  She is to contact us for any suboptimal progress at 2 weeks, if noted anticipate transition to cam boot, can also consider local corticosteroid injections around the tendon under ultrasound guidance. ? ?Acute issue, independent interpretation x-ray, Rx management  ? ?Orders & Medications ?Meds ordered this encounter  ?Medications  ? diclofenac (VOLTAREN) 75 MG EC tablet  ?  Sig: Take 1 tablet (75 mg total) by mouth 2 (two) times daily.  ?  Dispense:  30 tablet  ?  Refill:  0  ? ?No orders of the defined types were placed in this encounter. ?  ? ?No follow-ups on file.  ?  ? ?[name], MD ? ? Primary Care Sports Medicine ?[facility] ?[place]  ? ?

## 2021-08-16 ENCOUNTER — Ambulatory Visit: Payer: Self-pay | Admitting: Obstetrics & Gynecology

## 2021-08-20 ENCOUNTER — Other Ambulatory Visit: Payer: Self-pay

## 2021-08-21 ENCOUNTER — Other Ambulatory Visit: Payer: Self-pay | Admitting: Obstetrics & Gynecology

## 2021-08-21 ENCOUNTER — Encounter: Payer: Self-pay | Admitting: Obstetrics & Gynecology

## 2021-08-21 MED ORDER — ONDANSETRON 4 MG PO TBDP: 4.0000 mg | ORAL_TABLET | Freq: Four times a day (QID) | ORAL | 0 refills | Status: DC | PRN

## 2021-08-22 ENCOUNTER — Other Ambulatory Visit: Payer: Self-pay

## 2021-08-27 ENCOUNTER — Ambulatory Visit: Payer: Self-pay | Admitting: Obstetrics & Gynecology

## 2021-09-02 ENCOUNTER — Telehealth: Payer: Self-pay

## 2021-09-02 ENCOUNTER — Other Ambulatory Visit: Payer: Self-pay

## 2021-09-02 NOTE — Telephone Encounter (Signed)
Pt aware by detailed message. ?

## 2021-09-02 NOTE — Telephone Encounter (Signed)
OK to wait

## 2021-09-02 NOTE — Telephone Encounter (Signed)
Pt calling; has questions about prescription refill.  [id number] Pt states her insurance lapsed and the new ins won't go into effect until ~4/14th; she will take her last dose this Friday the 7th; is it okay to wait until insurance in effect before continuing?  Do we have samples? Adv I didn't think we have samples of Wegovy. ?

## 2021-09-05 ENCOUNTER — Telehealth: Payer: Self-pay

## 2021-09-05 NOTE — Telephone Encounter (Signed)
Ulice Dash calling from [employer]; new PA is in the portal - it's [id number].  [place] [employer]; spoke c Baldo Ash who gave pt name and DOB, medication is Wegovy; original Key ([id number]) was partially denied; with this new PA they need clinical notes. ?

## 2021-09-09 NOTE — Telephone Encounter (Signed)
The one I did last week

## 2021-09-09 NOTE — Telephone Encounter (Signed)
Pa states pt should be able to get the medicine without a PA

## 2021-09-09 NOTE — Telephone Encounter (Signed)
That's what is says on cover my meds ?

## 2021-09-23 ENCOUNTER — Other Ambulatory Visit: Payer: Self-pay

## 2021-09-24 ENCOUNTER — Other Ambulatory Visit: Payer: Self-pay

## 2021-10-10 ENCOUNTER — Other Ambulatory Visit: Payer: Self-pay

## 2021-10-18 ENCOUNTER — Other Ambulatory Visit: Payer: Self-pay

## 2022-01-28 ENCOUNTER — Telehealth: Payer: Self-pay | Admitting: Family Medicine

## 2022-01-28 ENCOUNTER — Encounter: Payer: Self-pay | Admitting: Family Medicine

## 2022-01-28 ENCOUNTER — Ambulatory Visit (INDEPENDENT_AMBULATORY_CARE_PROVIDER_SITE_OTHER): Payer: 59 | Admitting: Family Medicine

## 2022-01-28 VITALS — BP 122/70 | HR 72 | Temp 98.4°F | Ht 65.0 in | Wt 199.4 lb

## 2022-01-28 DIAGNOSIS — C539 Malignant neoplasm of cervix uteri, unspecified: Secondary | ICD-10-CM

## 2022-01-28 DIAGNOSIS — E8881 Metabolic syndrome: Secondary | ICD-10-CM | POA: Diagnosis not present

## 2022-01-28 DIAGNOSIS — E669 Obesity, unspecified: Secondary | ICD-10-CM | POA: Diagnosis not present

## 2022-01-28 DIAGNOSIS — R519 Headache, unspecified: Secondary | ICD-10-CM | POA: Diagnosis not present

## 2022-01-28 DIAGNOSIS — E88819 Insulin resistance, unspecified: Secondary | ICD-10-CM

## 2022-01-28 MED ORDER — METFORMIN HCL ER 500 MG PO TB24: 500.0000 mg | ORAL_TABLET | Freq: Every day | ORAL | 0 refills | Status: DC

## 2022-01-28 NOTE — Patient Instructions (Addendum)
It was a pleasure meeting you today. Thank you for allowing me to take part in your health care.  Our goals for today as we discussed include:  We will get some labs today.  If they are abnormal or we need to do something about them, I will call you.  If they are normal, I will send you a message on MyChart (if it is active) or a letter in the mail.  If you don't hear from [place] in 2 weeks, please call the office at the number below.   For your weight management Look at [facility] website to see if this may be an option for you with your weight loss. [facility] [place] [phone]   Start Metformin 500 mg daily.  Take with breakfast.  Please follow-up with PCP in 4 weeks  If you have any questions or concerns, please do not hesitate to call the office at [phone].  I look forward to our next visit and until then take care and stay safe.  Regards,   Carollee Leitz, MD   [facility] Eating Following a healthy eating pattern may help you to achieve and maintain a healthy body weight, reduce the risk of chronic disease, and live a long and productive life. It is important to follow a healthy eating pattern at an appropriate calorie level for your body. Your nutritional needs should be met primarily through food by choosing a variety of nutrient-rich foods. What are tips for following this plan? Reading food labels Read labels and choose the following: Reduced or low sodium. Juices with 100% fruit juice. Foods with low saturated fats and high polyunsaturated and monounsaturated fats. Foods with whole grains, such as whole wheat, cracked wheat, brown rice, and wild rice. Whole grains that are fortified with folic acid. This is recommended for women who are pregnant or who want to become pregnant. Read labels and avoid the following: Foods with a lot of added sugars. These include foods that contain  brown sugar, corn sweetener, corn syrup, dextrose, fructose, glucose, high-fructose corn syrup, honey, invert sugar, lactose, malt syrup, maltose, molasses, raw sugar, sucrose, trehalose, or turbinado sugar. Do not eat more than the following amounts of added sugar per day: 6 teaspoons (25 g) for women. 9 teaspoons (38 g) for men. Foods that contain processed or refined starches and grains. Refined grain products, such as white flour, degermed cornmeal, white bread, and white rice. Shopping Choose nutrient-rich snacks, such as vegetables, whole fruits, and nuts. Avoid high-calorie and high-sugar snacks, such as potato chips, fruit snacks, and candy. Use oil-based dressings and spreads on foods instead of solid fats such as butter, stick margarine, or cream cheese. Limit pre-made sauces, mixes, and "instant" products such as flavored rice, instant noodles, and ready-made pasta. Try more plant-protein sources, such as tofu, tempeh, black beans, edamame, lentils, nuts, and seeds. Explore eating plans such as the Mediterranean diet or vegetarian diet. Cooking Use oil to saut or stir-fry foods instead of solid fats such as butter, stick margarine, or lard. Try baking, boiling, grilling, or broiling instead of frying. Remove the fatty part of meats before cooking. Steam vegetables in water or broth. Meal planning  At meals, imagine dividing your plate into fourths: One-half of your plate is fruits and vegetables. One-fourth of your plate is whole grains. One-fourth of your plate is protein, especially lean meats, poultry, eggs, tofu, beans, or nuts. Include low-fat dairy  as part of your daily diet. Lifestyle Choose healthy options in all settings, including home, work, school, restaurants, or stores. Prepare your food safely: Wash your hands after handling raw meats. Keep food preparation surfaces clean by regularly washing with hot, soapy water. Keep raw meats separate from ready-to-eat  foods, such as fruits and vegetables. Cook seafood, meat, poultry, and eggs to the recommended internal temperature. Store foods at safe temperatures. In general: Keep cold foods at 59F (4.4C) or below. Keep hot foods at 159F (60C) or above. Keep your freezer at [facility] (-17.8C) or below. Foods are no longer safe to eat when they have been between the temperatures of 40-159F (4.4-60C) for more than 2 hours. What foods should I eat? Fruits Aim to eat 2 cup-equivalents of fresh, canned (in natural juice), or frozen fruits each day. Examples of 1 cup-equivalent of fruit include 1 small apple, 8 large strawberries, 1 cup canned fruit,  cup dried fruit, or 1 cup 100% juice. Vegetables Aim to eat 2-3 cup-equivalents of fresh and frozen vegetables each day, including different varieties and colors. Examples of 1 cup-equivalent of vegetables include 2 medium carrots, 2 cups raw, leafy greens, 1 cup chopped vegetable (raw or cooked), or 1 medium baked potato. Grains Aim to eat 6 ounce-equivalents of whole grains each day. Examples of 1 ounce-equivalent of grains include 1 slice of bread, 1 cup ready-to-eat cereal, 3 cups popcorn, or  cup cooked rice, pasta, or cereal. Meats and other proteins Aim to eat 5-6 ounce-equivalents of protein each day. Examples of 1 ounce-equivalent of protein include 1 egg, 1/2 cup nuts or seeds, or 1 tablespoon (16 g) peanut butter. A cut of meat or fish that is the size of a deck of cards is about 3-4 ounce-equivalents. Of the protein you eat each week, try to have at least 8 ounces come from seafood. This includes salmon, trout, herring, and anchovies. Dairy Aim to eat 3 cup-equivalents of fat-free or low-fat dairy each day. Examples of 1 cup-equivalent of dairy include 1 cup (240 mL) milk, 8 ounces (250 g) yogurt, 1 ounces (44 g) natural cheese, or 1 cup (240 mL) fortified soy milk. Fats and oils Aim for about 5 teaspoons (21 g) per day. Choose monounsaturated fats,  such as canola and olive oils, avocados, peanut butter, and most nuts, or polyunsaturated fats, such as sunflower, corn, and soybean oils, walnuts, pine nuts, sesame seeds, sunflower seeds, and flaxseed. Beverages Aim for six 8-oz glasses of water per day. Limit coffee to three to five 8-oz cups per day. Limit caffeinated beverages that have added calories, such as soda and energy drinks. Limit alcohol intake to no more than 1 drink a day for nonpregnant women and 2 drinks a day for men. One drink equals 12 oz of beer (355 mL), 5 oz of wine (148 mL), or 1 oz of hard liquor (44 mL). Seasoning and other foods Avoid adding excess amounts of salt to your foods. Try flavoring foods with herbs and spices instead of salt. Avoid adding sugar to foods. Try using oil-based dressings, sauces, and spreads instead of solid fats. This information is based on general [place] nutrition guidelines. For more information, visit [employer].es. Exact amounts may vary based on your nutrition needs. Summary A healthy eating plan may help you to maintain a healthy weight, reduce the risk of chronic diseases, and stay active throughout your life. Plan your meals. Make sure you eat the right portions of a variety of nutrient-rich foods. Try baking,  boiling, grilling, or broiling instead of frying. Choose healthy options in all settings, including home, work, school, restaurants, or stores. This information is not intended to replace advice given to you by your health care provider. Make sure you discuss any questions you have with your health care provider. Document Revised: 01/15/2021 Document Reviewed: 01/15/2021 Elsevier Patient Education  [place].

## 2022-01-28 NOTE — Progress Notes (Signed)
    SUBJECTIVE:   CHIEF COMPLAINT / HPI: establish care  Patient presents to clinic to establish care  Prediabetes Reports history of prediabetes and would like to work on lowering glucose.   Currently asymptomatic. Would like to work on [employer] and interested in starting Rybelsus for weight loss.  PERTINENT  PMH / PSH:  Prediabetes HLD Obesity Class 1 Migraines with Aura Uterine cancer Cervical cancer  Tubal ligation 0981 Umbilical hernia 1914 D&C 2018  Mom, HTN, HLD Dad, HTN, HLD Maternal GM, Melanoma Paternal GF, DM Type 2, ??CML Maternal Aunt, Bilateral breast ca  Former tobacco use, quit 2-3 years  EtOH socially No THC/Cocaine/Heroin   OBJECTIVE:   BP 122/70 (BP Location: Left Arm, Patient Position: Sitting, Cuff Size: Normal)   Pulse 72   Temp 98.4 F (36.9 C) (Oral)   Ht '5\' 5"'$  (1.651 m)   Wt 199 lb 6.4 oz (90.4 kg)   LMP 01/14/2022   SpO2 99%   BMI 33.18 kg/m    General: Alert, no acute distress Cardio: Normal S1 and S2, RRR, no r/m/g Pulm: CTAB, normal work of breathing Abdomen: Bowel sounds normal. Abdomen soft and non-tender.  Extremities: No peripheral edema.  Neuro: Cranial nerves grossly intact   ASSESSMENT/PLAN:   Obesity (BMI 30-39.9) Was prescribed Wegovy but not approved by insurance Patient requesting Rybelsus if possible.  Do not think this will be approved for prediabetes or weight loss. -TSH, CBC, CMET, Vit B12, Vit D and Lipids today -Start Metformin XR 500 mg daily for prediabetes -Encourage healthy eating and exercise -Follow up with PCP in 4 weeks  Cervical cancer ([place]) H/O cervical and uterine cancer per patient.  Follow[facility] with [facility]on [facility] for yearly PAP.   Continue to follow with OBGYN Next PAP in Nov   Headache History of Migraines  Takes Maxalt as needed with relief -Continue Maxalt 10 mg at onset of headache, repeat x1 if not resolved within 2 hrs -Recommend increase sleep, water intake, no  skipping meals and decrease stress -Consider Neurology referral if worsens    PDMP reviewed  Carollee Leitz, MD

## 2022-01-28 NOTE — Telephone Encounter (Signed)
Pt states she will call back to sch appt after the lab results come back. Thank you!

## 2022-01-29 ENCOUNTER — Encounter: Payer: Self-pay | Admitting: Family Medicine

## 2022-01-31 ENCOUNTER — Other Ambulatory Visit: Payer: 59

## 2022-02-03 ENCOUNTER — Encounter: Payer: Self-pay | Admitting: Family Medicine

## 2022-02-03 NOTE — Assessment & Plan Note (Signed)
Was prescribed Wegovy but not approved by insurance Patient requesting Rybelsus if possible.  Do not think this will be approved for prediabetes or weight loss. -TSH, CBC, CMET, Vit B12, Vit D and Lipids today -Start Metformin XR 500 mg daily for prediabetes -Encourage healthy eating and exercise -Follow up with PCP in 4 weeks

## 2022-02-03 NOTE — Assessment & Plan Note (Signed)
History of Migraines  Takes Maxalt as needed with relief -Continue Maxalt 10 mg at onset of headache, repeat x1 if not resolved within 2 hrs -Recommend increase sleep, water intake, no skipping meals and decrease stress -Consider Neurology referral if worsens

## 2022-02-03 NOTE — Assessment & Plan Note (Signed)
H/O cervical and uterine cancer per patient.  Follows with Dr Kenton Kingfisher at [facility] for yearly PAP.   Continue to follow with OBGYN Next PAP in Nov

## 2022-05-30 ENCOUNTER — Telehealth: Payer: Self-pay | Admitting: Family Medicine

## 2022-06-03 ENCOUNTER — Ambulatory Visit: Payer: 59 | Admitting: Family Medicine

## 2022-06-03 DIAGNOSIS — Z79899 Other long term (current) drug therapy: Secondary | ICD-10-CM

## 2022-06-06 ENCOUNTER — Ambulatory Visit (INDEPENDENT_AMBULATORY_CARE_PROVIDER_SITE_OTHER): Payer: 59 | Admitting: Podiatry

## 2022-06-06 DIAGNOSIS — B351 Tinea unguium: Secondary | ICD-10-CM | POA: Diagnosis not present

## 2022-06-06 DIAGNOSIS — B353 Tinea pedis: Secondary | ICD-10-CM

## 2022-06-08 ENCOUNTER — Encounter: Payer: Self-pay | Admitting: Podiatry

## 2022-06-09 MED ORDER — TERBINAFINE HCL 250 MG PO TABS: 250.0000 mg | ORAL_TABLET | Freq: Every day | ORAL | 0 refills | Status: DC

## 2022-06-09 MED ORDER — CLOTRIMAZOLE-BETAMETHASONE 1-0.05 % EX CREA: 1.0000 | TOPICAL_CREAM | Freq: Two times a day (BID) | CUTANEOUS | 1 refills | Status: DC

## 2022-06-09 NOTE — Progress Notes (Signed)
   Chief Complaint  Patient presents with   Foot Problem    Patient her for nail dystrophy, foot dryness, cracking     Subjective: 37 y.o. female presenting today as a new patient for evaluation of thickening with discoloration to the toenails of the right foot that is been ongoing for few years now.  She also gets dry cracking skin which itches occasionally.  This been present for a few years now.  Denies a history of injury.  She has not done anything for treatment other than topical antifungal which has not improved her condition of the toenails.  Past Medical History:  Diagnosis Date   Cancer [facility])    Cervical dysplasia 2007   LEEP   Colitis    Depression    Diabetes mellitus without complication (HCC)    Migraine    1x/mo    Past Surgical History:  Procedure Laterality Date   DILATION AND CURETTAGE OF UTERUS     HERNIA REPAIR     LEEP  2007   TUBAL LIGATION      Allergies  Allergen Reactions   Benadryl [Diphenhydramine] Hives   Ibuprofen Hives   Lactose Intolerance (Gi) Diarrhea     Objective: Physical Exam General: The patient is alert and oriented x3 in no acute distress.  Dermatology: Hyperkeratotic, discolored, thickened, onychodystrophy noted to digits 1-5 of the right foot.  There is also some hyperkeratosis of skin with pruritus noted.  Vascular: Palpable pedal pulses bilaterally. No edema or erythema noted. Capillary refill within normal limits.  Neurological: Epicritic and protective threshold grossly intact bilaterally.   Musculoskeletal Exam: No pedal deformity noted  Assessment: #1 Onychomycosis of toenails  Plan of Care:  #1 Patient was evaluated. #2  Today we discussed different treatment options including oral, topical, and laser antifungal treatment modalities.  We discussed their efficacies and side effects.  Patient opts for oral antifungal treatment modality #3 prescription for Lamisil 250 mg #90 daily. Pt denies a history of liver  pathology or symptoms.  Patient is otherwise healthy #4  Prescription also for Lotrisone cream apply 2 times daily  #5 return to clinic 6 months   Edrick Kins, DPM [facility]  Dr. Edrick Kins, DPM    [address], [place] [address]                Office [phone]  Fax [phone]

## 2022-06-16 ENCOUNTER — Ambulatory Visit: Payer: 59 | Admitting: Family Medicine

## 2022-07-07 ENCOUNTER — Encounter: Payer: 59 | Admitting: Family Medicine

## 2022-07-30 ENCOUNTER — Telehealth: Payer: 59

## 2022-07-30 ENCOUNTER — Ambulatory Visit
Admission: EM | Admit: 2022-07-30 | Discharge: 2022-07-30 | Disposition: A | Discharge status: Home / Self Care | Payer: 59 | Attending: Emergency Medicine | Admitting: Emergency Medicine

## 2022-07-30 DIAGNOSIS — R509 Fever, unspecified: Secondary | ICD-10-CM | POA: Insufficient documentation

## 2022-07-30 DIAGNOSIS — E119 Type 2 diabetes mellitus without complications: Secondary | ICD-10-CM | POA: Diagnosis not present

## 2022-07-30 DIAGNOSIS — Z1152 Encounter for screening for COVID-19: Secondary | ICD-10-CM | POA: Insufficient documentation

## 2022-07-30 DIAGNOSIS — B349 Viral infection, unspecified: Secondary | ICD-10-CM | POA: Insufficient documentation

## 2022-07-30 LAB — POCT INFLUENZA A/B
Influenza A, POC: NEGATIVE
Influenza B, POC: NEGATIVE

## 2022-07-30 NOTE — ED Triage Notes (Signed)
Patient to Urgent Care with complaints of intermittent fevers/ chills/ headaches/ body aches and dry cough.  Temp this morning 100.8. Taking nyquil.   Symptoms started yesterday.

## 2022-07-30 NOTE — Discharge Instructions (Addendum)
Your flu test is negative.    Your COVID test is pending.    Take Tylenol as needed for fever or discomfort.  Rest and keep yourself hydrated.    Follow-up with your primary care provider if your symptoms are not improving.

## 2022-07-30 NOTE — ED Provider Notes (Signed)
[name]    CSN: [id number] Arrival date & time: 07/30/22  1306      History   Chief Complaint Chief Complaint  Patient presents with   Fever    Headache, chills, body aches, cough - Entered by patient    HPI Desiree Green is a 37 y.o. female.  Patient presents with fever, chills, body aches, headache, cough since last night.  Tmax 100.8.  No OTC medications taken today but she took NyQuil last night.  She denies rash, ear pain, sore throat, shortness of breath, vomiting, diarrhea, or other symptoms.  Her medical history includes diabetes, migraine headache, cervical cancer, colitis, depression.    The history is provided by the patient and medical records.    Past Medical History:  Diagnosis Date   Cancer [facility])    Cervical dysplasia 2007   LEEP   Colitis    Depression    Diabetes mellitus without complication ([place])    Migraine    1x/mo    Patient Active Problem List   Diagnosis Date Noted   Tenosynovitis of extensor hallucis longus tendon 08/08/2021   Obesity (BMI 30-39.9) 06/24/2021   Menorrhagia with regular cycle 04/30/2021   Cervical cancer ([place]) 12/19/2019   Metrorrhagia 02/24/2018   Overweight (BMI 25.0-29.9) 02/24/2018   Headache 123456   Umbilical hernia 123456   Disorder of sacrum 04/21/2012   Low back pain 04/21/2012    Past Surgical History:  Procedure Laterality Date   DILATION AND CURETTAGE OF UTERUS     HERNIA REPAIR     LEEP  2007   TUBAL LIGATION      OB History     Gravida  4   Para  2   Term  2   Preterm      AB  2   Living  2      SAB  1   IAB      Ectopic  1   Multiple      Live Births               Home Medications    Prior to Admission medications   Medication Sig Start Date End Date Taking? Authorizing Provider  albuterol (VENTOLIN HFA) 108 (90 Base) MCG/ACT inhaler SMARTSIG:2 Puff(s) By Mouth Every 4 Hours PRN 05/04/21   [provider]  clotrimazole-betamethasone  (LOTRISONE) cream Apply 1 Application topically 2 (two) times daily. 06/09/22   Edrick Kins, DPM  metFORMIN (GLUCOPHAGE-XR) 500 MG 24 hr tablet Take 1 tablet (500 mg total) by mouth daily with breakfast. Patient not taking: Reported on 07/30/2022 01/28/22   Carollee Leitz, MD  rizatriptan (MAXALT) 10 MG tablet Take 1 tablet (10 mg total) by mouth as needed for migraine. May repeat in 2 hours if needed; this is a 30 day supply Patient not taking: Reported on 07/30/2022 05/06/21 08/08/21  Mar Daring, PA-C  rizatriptan (MAXALT) 10 MG tablet Take 10 mg by mouth as needed for migraine. May repeat in 2 hours if needed    [provider]  terbinafine (LAMISIL) 250 MG tablet Take 1 tablet (250 mg total) by mouth daily. Patient not taking: Reported on 07/30/2022 06/09/22   Edrick Kins, DPM    Family History Family History  Problem Relation Age of Onset   Breast cancer Maternal Aunt    Hypertension Father    Ovarian cancer Sister    Melanoma Maternal Grandmother    Colon cancer Paternal Grandfather  Lung cancer Paternal Grandfather    Thyroid disease Mother     Social History Social History   Tobacco Use   Smoking status: Former    Packs/day: 1.00    Years: 16.00    Total pack years: 16.00    Types: Cigarettes    Quit date: 05/07/2018    Years since quitting: 4.2   Smokeless tobacco: Never   Tobacco comments:    started age 57.    Vaping Use   Vaping Use: Never used  Substance Use Topics   Alcohol use: Not Currently    Comment: rarely   Drug use: No     Allergies   Benadryl [diphenhydramine], Ibuprofen, and Lactose intolerance (gi)   Review of Systems Review of Systems  Constitutional:  Positive for chills, fatigue and fever.  HENT:  Negative for ear pain and sore throat.   Respiratory:  Positive for cough. Negative for shortness of breath.   Cardiovascular:  Negative for chest pain and palpitations.  Gastrointestinal:  Negative for diarrhea and vomiting.   Skin:  Negative for rash.  Neurological:  Positive for headaches.  All other systems reviewed and are negative.    Physical Exam Triage Vital Signs ED Triage Vitals  Enc Vitals Group     BP 07/30/22 1317 120/84     Pulse Rate 07/30/22 1314 (!) 107     Resp 07/30/22 1314 18     Temp 07/30/22 1314 98.9 F (37.2 C)     Temp src --      SpO2 07/30/22 1314 96 %     Weight --      Height --      Head Circumference --      Peak Flow --      Pain Score 07/30/22 1314 5     Pain Loc --      Pain Edu? --      Excl. in [place]? --    No data found.  Updated Vital Signs BP 120/84   Pulse (!) 107   Temp 98.9 F (37.2 C)   Resp 18   LMP 07/23/2022   SpO2 96%   Visual Acuity Right Eye Distance:   Left Eye Distance:   Bilateral Distance:    Right Eye Near:   Left Eye Near:    Bilateral Near:     Physical Exam Vitals and nursing note reviewed.  Constitutional:      General: She is not in acute distress.    Appearance: She is well-developed. She is ill-appearing.  HENT:     Right Ear: Tympanic membrane normal.     Left Ear: Tympanic membrane normal.     Nose: Nose normal.     Mouth/Throat:     Mouth: Mucous membranes are moist.     Pharynx: Oropharynx is clear.  Cardiovascular:     Rate and Rhythm: Normal rate and regular rhythm.     Heart sounds: Normal heart sounds.  Pulmonary:     Effort: Pulmonary effort is normal. No respiratory distress.     Breath sounds: Normal breath sounds.  Musculoskeletal:     Cervical back: Neck supple.  Skin:    General: Skin is warm and dry.  Neurological:     Mental Status: She is alert.  Psychiatric:        Mood and Affect: Mood normal.        Behavior: Behavior normal.      UC Treatments / Results  Labs (all labs ordered  are listed, but only abnormal results are displayed) Labs Reviewed  SARS CORONAVIRUS 2 (TAT 6-24 HRS)  POCT INFLUENZA A/B    EKG   Radiology No results found.  Procedures Procedures (including  critical care time)  Medications Ordered in UC Medications - No data to display  Initial Impression / Assessment and Plan / UC Course  I have reviewed the triage vital signs and the nursing notes.  Pertinent labs & imaging results that were available during my care of the patient were reviewed by me and considered in my medical decision making (see chart for details).    Viral illness.  Rapid flu negative.  COVID pending.  Discussed symptomatic treatment including Tylenol, rest, hydration.  Instructed patient to follow up with her PCP if symptoms are not improving.  Education provided on viral illness. She agrees to plan of care.   Final Clinical Impressions(s) / UC Diagnoses   Final diagnoses:  Viral illness     Discharge Instructions      Your flu test is negative.    Your COVID test is pending.    Take Tylenol as needed for fever or discomfort.  Rest and keep yourself hydrated.    Follow-up with your primary care provider if your symptoms are not improving.         ED Prescriptions   None    PDMP not reviewed this encounter.   Sharion Balloon, NP 07/30/22 [phone]

## 2022-07-31 LAB — SARS CORONAVIRUS 2 (TAT 6-24 HRS): SARS Coronavirus 2: NEGATIVE

## 2022-08-01 ENCOUNTER — Telehealth: Payer: 59 | Admitting: Physician Assistant

## 2022-08-01 DIAGNOSIS — B9689 Other specified bacterial agents as the cause of diseases classified elsewhere: Secondary | ICD-10-CM

## 2022-08-01 DIAGNOSIS — J208 Acute bronchitis due to other specified organisms: Secondary | ICD-10-CM

## 2022-08-01 MED ORDER — PROMETHAZINE-DM 6.25-15 MG/5ML PO SYRP: 5.0000 mL | ORAL_SOLUTION | Freq: Four times a day (QID) | ORAL | 0 refills | Status: DC | PRN

## 2022-08-01 MED ORDER — AZITHROMYCIN 250 MG PO TABS
ORAL_TABLET | ORAL | 0 refills | Status: AC
Start: 2022-08-01 — End: 2022-08-06

## 2022-08-01 MED ORDER — PREDNISONE 20 MG PO TABS
40.0000 mg | ORAL_TABLET | Freq: Every day | ORAL | 0 refills | Status: AC
Start: 2022-08-01 — End: ?

## 2022-08-01 MED ORDER — BENZONATATE 100 MG PO CAPS: 100.0000 mg | ORAL_CAPSULE | Freq: Three times a day (TID) | ORAL | 0 refills | Status: DC | PRN

## 2022-08-01 NOTE — Patient Instructions (Signed)
[place], thank you for joining Mar Daring, PA-C for today's virtual visit.  While this provider is not your primary care provider (PCP), if your PCP is located in our provider database this encounter information will be shared with them immediately following your visit.   [place] [place] account gives you access to today's visit and all your visits, tests, and labs performed at [facility] " click here if you don't have a [facility] account or go to [email or web address]  Consent: (Patient) Desiree Green provided verbal consent for this virtual visit at the beginning of the encounter.  Current Medications:  Current Outpatient Medications:    azithromycin (ZITHROMAX) 250 MG tablet, Take 2 tablets on day 1, then 1 tablet daily on days 2 through 5, Disp: 6 tablet, Rfl: 0   benzonatate (TESSALON) 100 MG capsule, Take 1 capsule (100 mg total) by mouth 3 (three) times daily as needed., Disp: 30 capsule, Rfl: 0   predniSONE (DELTASONE) 20 MG tablet, Take 2 tablets (40 mg total) by mouth daily with breakfast., Disp: 10 tablet, Rfl: 0   promethazine-dextromethorphan (PROMETHAZINE-DM) 6.25-15 MG/5ML syrup, Take 5 mLs by mouth 4 (four) times daily as needed., Disp: 118 mL, Rfl: 0   albuterol (VENTOLIN HFA) 108 (90 Base) MCG/ACT inhaler, SMARTSIG:2 Puff(s) By Mouth Every 4 Hours PRN, Disp: , Rfl:    clotrimazole-betamethasone (LOTRISONE) cream, Apply 1 Application topically 2 (two) times daily., Disp: 45 g, Rfl: 1   metFORMIN (GLUCOPHAGE-XR) 500 MG 24 hr tablet, Take 1 tablet (500 mg total) by mouth daily with breakfast. (Patient not taking: Reported on 07/30/2022), Disp: 30 tablet, Rfl: 0   rizatriptan (MAXALT) 10 MG tablet, Take 1 tablet (10 mg total) by mouth as needed for migraine. May repeat in 2 hours if needed; this is a 30 day supply (Patient not taking: Reported on 07/30/2022), Disp: 10 tablet, Rfl: 0   rizatriptan (MAXALT) 10 MG tablet, Take 10 mg  by mouth as needed for migraine. May repeat in 2 hours if needed, Disp: , Rfl:    terbinafine (LAMISIL) 250 MG tablet, Take 1 tablet (250 mg total) by mouth daily. (Patient not taking: Reported on 07/30/2022), Disp: 90 tablet, Rfl: 0   Medications ordered in this encounter:  Meds ordered this encounter  Medications   azithromycin (ZITHROMAX) 250 MG tablet    Sig: Take 2 tablets on day 1, then 1 tablet daily on days 2 through 5    Dispense:  6 tablet    Refill:  0    Order Specific Question:   Supervising Provider    Answer:   Chase Picket [id number]   predniSONE (DELTASONE) 20 MG tablet    Sig: Take 2 tablets (40 mg total) by mouth daily with breakfast.    Dispense:  10 tablet    Refill:  0    Order Specific Question:   Supervising Provider    Answer:   Chase Picket [id number]   promethazine-dextromethorphan (PROMETHAZINE-DM) 6.25-15 MG/5ML syrup    Sig: Take 5 mLs by mouth 4 (four) times daily as needed.    Dispense:  118 mL    Refill:  0    Order Specific Question:   Supervising Provider    Answer:   Chase Picket [id number]   benzonatate (TESSALON) 100 MG capsule    Sig: Take 1 capsule (100 mg total) by mouth 3 (three) times daily as needed.    Dispense:  30 capsule  Refill:  0    Order Specific Question:   Supervising Provider    Answer:   Chase Picket [id number]     *If you need refills on other medications prior to your next appointment, please contact your pharmacy*  Follow-Up: Call back or seek an in-person evaluation if the symptoms worsen or if the condition fails to improve as anticipated.  [place] [phone]  Other Instructions  Acute Bronchitis, Adult  Acute bronchitis is sudden inflammation of the main airways (bronchi) that come off the windpipe (trachea) in the lungs. The swelling causes the airways to get smaller and make more mucus than normal. This can make it hard to breathe and can cause coughing or noisy breathing  (wheezing). Acute bronchitis may last several weeks. The cough may last longer. Allergies, asthma, and exposure to smoke may make the condition worse. What are the causes? This condition can be caused by germs and by substances that irritate the lungs, including: Cold and flu viruses. The most common cause of this condition is the virus that causes the common cold. Bacteria. This is less common. Breathing in substances that irritate the lungs, including: Smoke from cigarettes and other forms of tobacco. Dust and pollen. Fumes from household cleaning products, gases, or burned fuel. Indoor or outdoor air pollution. What increases the risk? The following factors may make you more likely to develop this condition: A weak body's defense system, also called the immune system. A condition that affects your lungs and breathing, such as asthma. What are the signs or symptoms? Common symptoms of this condition include: Coughing. This may bring up clear, yellow, or green mucus from your lungs (sputum). Wheezing. Runny or stuffy nose. Having too much mucus in your lungs (chest congestion). Shortness of breath. Aches and pains, including sore throat or chest. How is this diagnosed? This condition is usually diagnosed based on: Your symptoms and medical history. A physical exam. You may also have other tests, including tests to rule out other conditions, such as pneumonia. These tests include: A test of lung function. Test of a mucus sample to look for the presence of bacteria. Tests to check the oxygen level in your blood. Blood tests. Chest X-ray. How is this treated? Most cases of acute bronchitis clear up over time without treatment. Your health care provider may recommend: Drinking more fluids to help thin your mucus so it is easier to cough up. Taking inhaled medicine (inhaler) to improve air flow in and out of your lungs. Using a vaporizer or a humidifier. These are machines that add  water to the air to help you breathe better. Taking a medicine that thins mucus and clears congestion (expectorant). Taking a medicine that prevents or stops coughing (cough suppressant). It is not common to take an antibiotic medicine for this condition. Follow these instructions at home:  Take over-the-counter and prescription medicines only as told by your health care provider. Use an inhaler, vaporizer, or humidifier as told by your health care provider. Take two teaspoons (10 mL) of honey at bedtime to lessen coughing at night. Drink enough fluid to keep your urine pale yellow. Do not use any products that contain nicotine or tobacco. These products include cigarettes, chewing tobacco, and vaping devices, such as e-cigarettes. If you need help quitting, ask your health care provider. Get plenty of rest. Return to your normal activities as told by your health care provider. Ask your health care provider what activities are safe for you.  Keep all follow-up visits. This is important. How is this prevented? To lower your risk of getting this condition again: Wash your hands often with soap and water for at least 20 seconds. If soap and water are not available, use hand sanitizer. Avoid contact with people who have cold symptoms. Try not to touch your mouth, nose, or eyes with your hands. Avoid breathing in smoke or chemical fumes. Breathing smoke or chemical fumes will make your condition worse. Get the flu shot every year. Contact a health care provider if: Your symptoms do not improve after 2 weeks. You have trouble coughing up the mucus. Your cough keeps you awake at night. You have a fever. Get help right away if you: Cough up blood. Feel pain in your chest. Have severe shortness of breath. Faint or keep feeling like you are going to faint. Have a severe headache. Have a fever or chills that get worse. These symptoms may represent a serious problem that is an emergency. Do not  wait to see if the symptoms will go away. Get medical help right away. Call your local emergency services (911 in the [place] Do not drive yourself to the hospital. Summary Acute bronchitis is inflammation of the main airways (bronchi) that come off the windpipe (trachea) in the lungs. The swelling causes the airways to get smaller and make more mucus than normal. Drinking more fluids can help thin your mucus so it is easier to cough up. Take over-the-counter and prescription medicines only as told by your health care provider. Do not use any products that contain nicotine or tobacco. These products include cigarettes, chewing tobacco, and vaping devices, such as e-cigarettes. If you need help quitting, ask your health care provider. Contact a health care provider if your symptoms do not improve after 2 weeks. This information is not intended to replace advice given to you by your health care provider. Make sure you discuss any questions you have with your health care provider. Document Revised: 08/29/2021 Document Reviewed: 09/19/2020 Elsevier Patient Education  [place].    If you have been instructed to have an in-person evaluation today at a local Urgent Care facility, please use the link below. It will take you to a list of all of our available [facility], including address, phone number and hours of operation. Please do not delay care.  [facility]  If you or a family member do not have a primary care provider, use the link below to schedule a visit and establish care. When you choose a [facility] primary care physician or advanced practice provider, you gain a long-term partner in health. Find a Primary Care Provider  Learn more about [facility]'s in-office and virtual care options: [facility]

## 2022-08-01 NOTE — Progress Notes (Signed)
Virtual Visit Consent   Desiree Green, you are scheduled for a virtual visit with a [facility] provider today. Just as with appointments in the office, your consent must be obtained to participate. Your consent will be active for this visit and any virtual visit you may have with one of our providers in the next 365 days. If you have a MyChart account, a copy of this consent can be sent to you electronically.  As this is a virtual visit, video technology does not allow for your provider to perform a traditional examination. This may limit your provider's ability to fully assess your condition. If your provider identifies any concerns that need to be evaluated in person or the need to arrange testing (such as labs, EKG, etc.), we will make arrangements to do so. Although advances in technology are sophisticated, we cannot ensure that it will always work on either your end or our end. If the connection with a video visit is poor, the visit may have to be switched to a telephone visit. With either a video or telephone visit, we are not always able to ensure that we have a secure connection.  By engaging in this virtual visit, you consent to the provision of healthcare and authorize for your insurance to be billed (if applicable) for the services provided during this visit. Depending on your insurance coverage, you may receive a charge related to this service.  I need to obtain your verbal consent now. Are you willing to proceed with your visit today? Desiree Green has provided verbal consent on 08/01/2022 for a virtual visit (video or telephone). Mar Daring, PA-C  Date: 08/01/2022 4:45 PM  Virtual Visit via Video Note   I, Mar Daring, connected with  Desiree Green  ([id number], December 06, 1985) on 08/01/22 at  4:45 PM EST by a video-enabled telemedicine application and verified that I am speaking with the correct person using two identifiers.  Location: Patient: Virtual Visit  Location Patient: Home Provider: Virtual Visit Location Provider: Home Office   I discussed the limitations of evaluation and management by telemedicine and the availability of in person appointments. The patient expressed understanding and agreed to proceed.    History of Present Illness: Desiree Green is a 37 y.o. who identifies as a female who was assigned female at birth, and is being seen today for cough.  HPI: Cough This is a new problem. The current episode started in the past 7 days (Started Tuesday). The problem has been gradually worsening. The problem occurs constantly. The cough is Productive of sputum and productive of purulent sputum. Associated symptoms include chills, a fever, myalgias, nasal congestion, postnasal drip, rhinorrhea and a sore throat. Pertinent negatives include no ear congestion, ear pain, shortness of breath or wheezing. The symptoms are aggravated by lying down. She has tried OTC cough suppressant for the symptoms. The treatment provided no relief. Her past medical history is significant for asthma.   Seen in person UC on Wednesday, 07/30/22, diagnosed with viral illness   Problems:  Patient Active Problem List   Diagnosis Date Noted   Tenosynovitis of extensor hallucis longus tendon 08/08/2021   Obesity (BMI 30-39.9) 06/24/2021   Menorrhagia with regular cycle 04/30/2021   Cervical cancer ([place]) 12/19/2019   Metrorrhagia 02/24/2018   Overweight (BMI 25.0-29.9) 02/24/2018   Headache 123456   Umbilical hernia 123456   Disorder of sacrum 04/21/2012   Low back pain 04/21/2012    Allergies:  Allergies  Allergen  Reactions   Benadryl [Diphenhydramine] Hives   Ibuprofen Hives   Lactose Intolerance (Gi) Diarrhea   Medications:  Current Outpatient Medications:    azithromycin (ZITHROMAX) 250 MG tablet, Take 2 tablets on day 1, then 1 tablet daily on days 2 through 5, Disp: 6 tablet, Rfl: 0   benzonatate (TESSALON) 100 MG capsule, Take 1 capsule  (100 mg total) by mouth 3 (three) times daily as needed., Disp: 30 capsule, Rfl: 0   predniSONE (DELTASONE) 20 MG tablet, Take 2 tablets (40 mg total) by mouth daily with breakfast., Disp: 10 tablet, Rfl: 0   promethazine-dextromethorphan (PROMETHAZINE-DM) 6.25-15 MG/5ML syrup, Take 5 mLs by mouth 4 (four) times daily as needed., Disp: 118 mL, Rfl: 0   albuterol (VENTOLIN HFA) 108 (90 Base) MCG/ACT inhaler, SMARTSIG:2 Puff(s) By Mouth Every 4 Hours PRN, Disp: , Rfl:    clotrimazole-betamethasone (LOTRISONE) cream, Apply 1 Application topically 2 (two) times daily., Disp: 45 g, Rfl: 1   metFORMIN (GLUCOPHAGE-XR) 500 MG 24 hr tablet, Take 1 tablet (500 mg total) by mouth daily with breakfast. (Patient not taking: Reported on 07/30/2022), Disp: 30 tablet, Rfl: 0   rizatriptan (MAXALT) 10 MG tablet, Take 1 tablet (10 mg total) by mouth as needed for migraine. May repeat in 2 hours if needed; this is a 30 day supply (Patient not taking: Reported on 07/30/2022), Disp: 10 tablet, Rfl: 0   rizatriptan (MAXALT) 10 MG tablet, Take 10 mg by mouth as needed for migraine. May repeat in 2 hours if needed, Disp: , Rfl:    terbinafine (LAMISIL) 250 MG tablet, Take 1 tablet (250 mg total) by mouth daily. (Patient not taking: Reported on 07/30/2022), Disp: 90 tablet, Rfl: 0  Observations/Objective: Patient is well-developed, well-nourished in no acute distress.  Resting comfortably at home.  Head is normocephalic, atraumatic.  No labored breathing.  Speech is clear and coherent with logical content.  Patient is alert and oriented at baseline.    Assessment and Plan: 1. Acute bacterial bronchitis - azithromycin (ZITHROMAX) 250 MG tablet; Take 2 tablets on day 1, then 1 tablet daily on days 2 through 5  Dispense: 6 tablet; Refill: 0 - predniSONE (DELTASONE) 20 MG tablet; Take 2 tablets (40 mg total) by mouth daily with breakfast.  Dispense: 10 tablet; Refill: 0 - promethazine-dextromethorphan (PROMETHAZINE-DM)  6.25-15 MG/5ML syrup; Take 5 mLs by mouth 4 (four) times daily as needed.  Dispense: 118 mL; Refill: 0 - benzonatate (TESSALON) 100 MG capsule; Take 1 capsule (100 mg total) by mouth 3 (three) times daily as needed.  Dispense: 30 capsule; Refill: 0  - Worsening over a week despite OTC medications - Will treat with Z-pack, Prednisone, Promethazine DM and tessalon perles - Can continue Mucinex  - Push fluids.  - Rest.  - Steam and humidifier can help - Seek in person evaluation if worsening or symptoms fail to improve    Follow Up Instructions: I discussed the assessment and treatment plan with the patient. The patient was provided an opportunity to ask questions and all were answered. The patient agreed with the plan and demonstrated an understanding of the instructions.  A copy of instructions were sent to the patient via MyChart unless otherwise noted below.    The patient was advised to call back or seek an in-person evaluation if the symptoms worsen or if the condition fails to improve as anticipated.  Time:  I spent 8 minutes with the patient via telehealth technology discussing the above problems/concerns.    [place]  Dorothy Puffer, PA-C

## 2022-08-08 IMAGING — CR DG TIBIA/FIBULA 2V*R*
2 series · 2 of 2 positions shown · non-contrast
Comparison: None.

CLINICAL DATA: Right lower leg pain for 2-3 weeks with no apparent
injury. Patient reports pain increases when weight-bearing on the
lower third portion of shin.

EXAM:
RIGHT TIBIA AND FIBULA - 2 VIEW

[tibia ap]
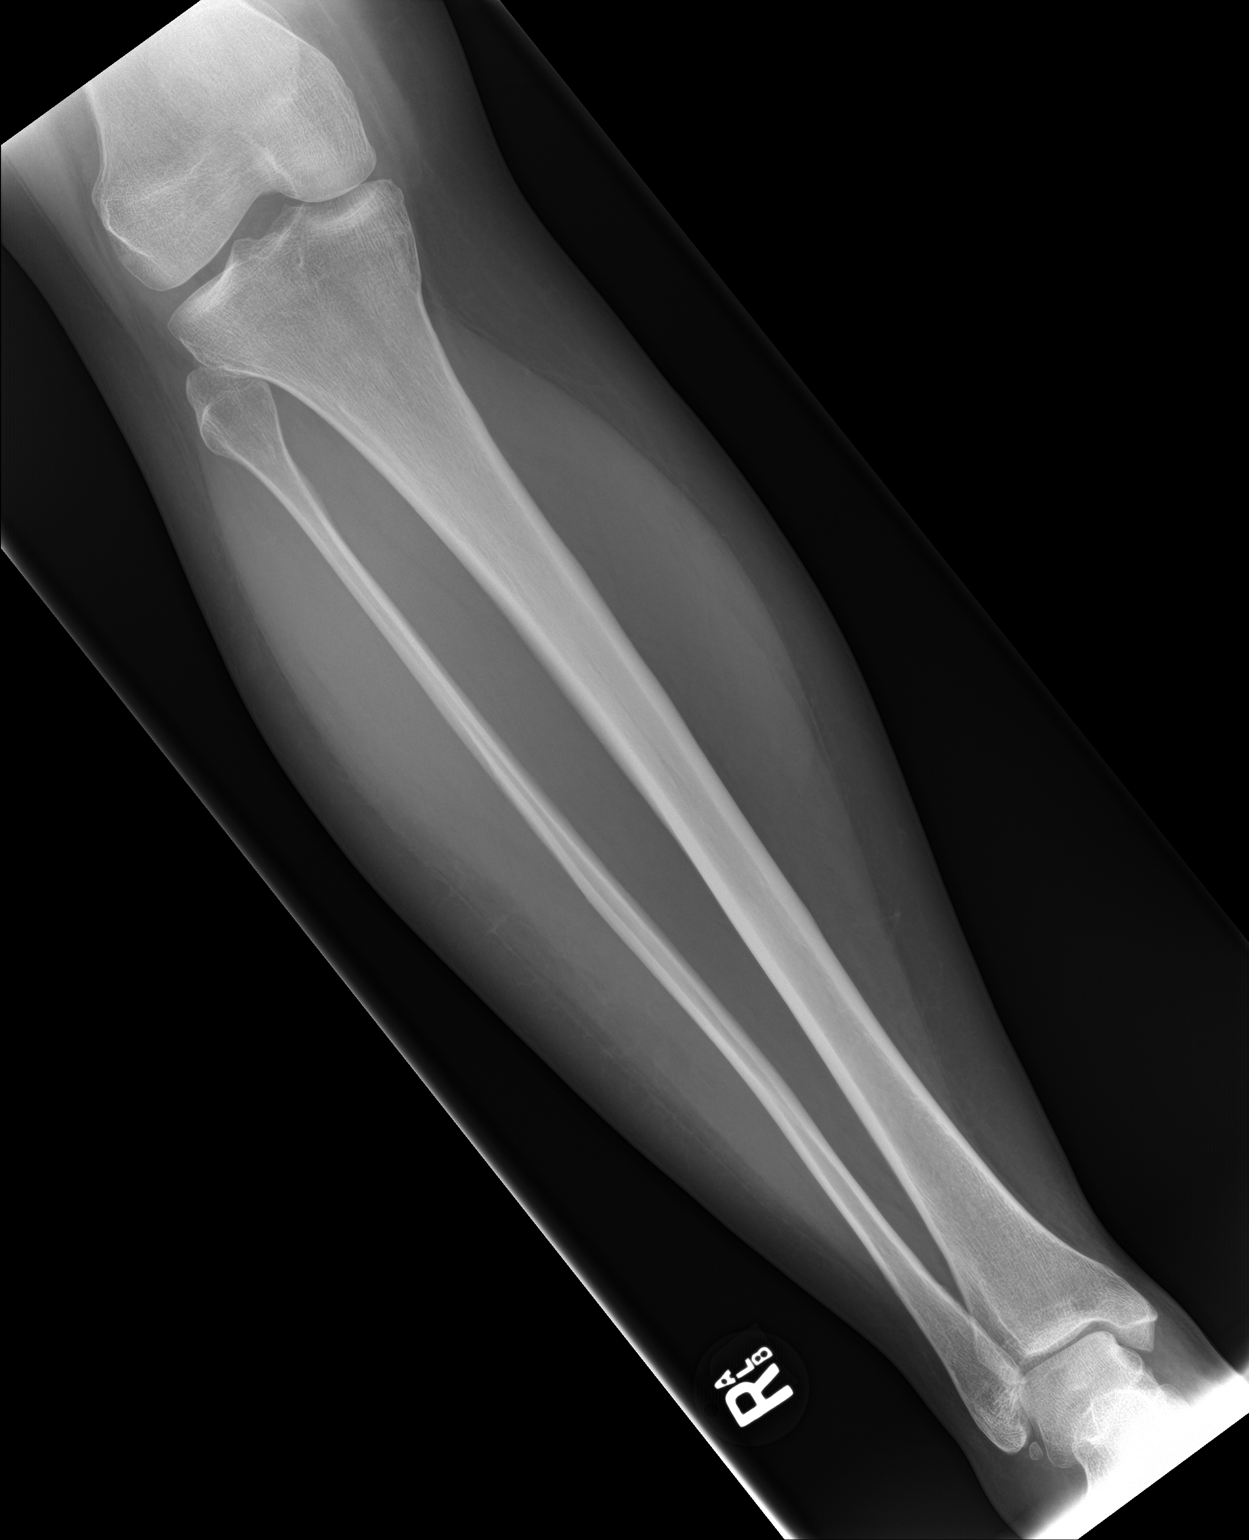

[tibia lat]
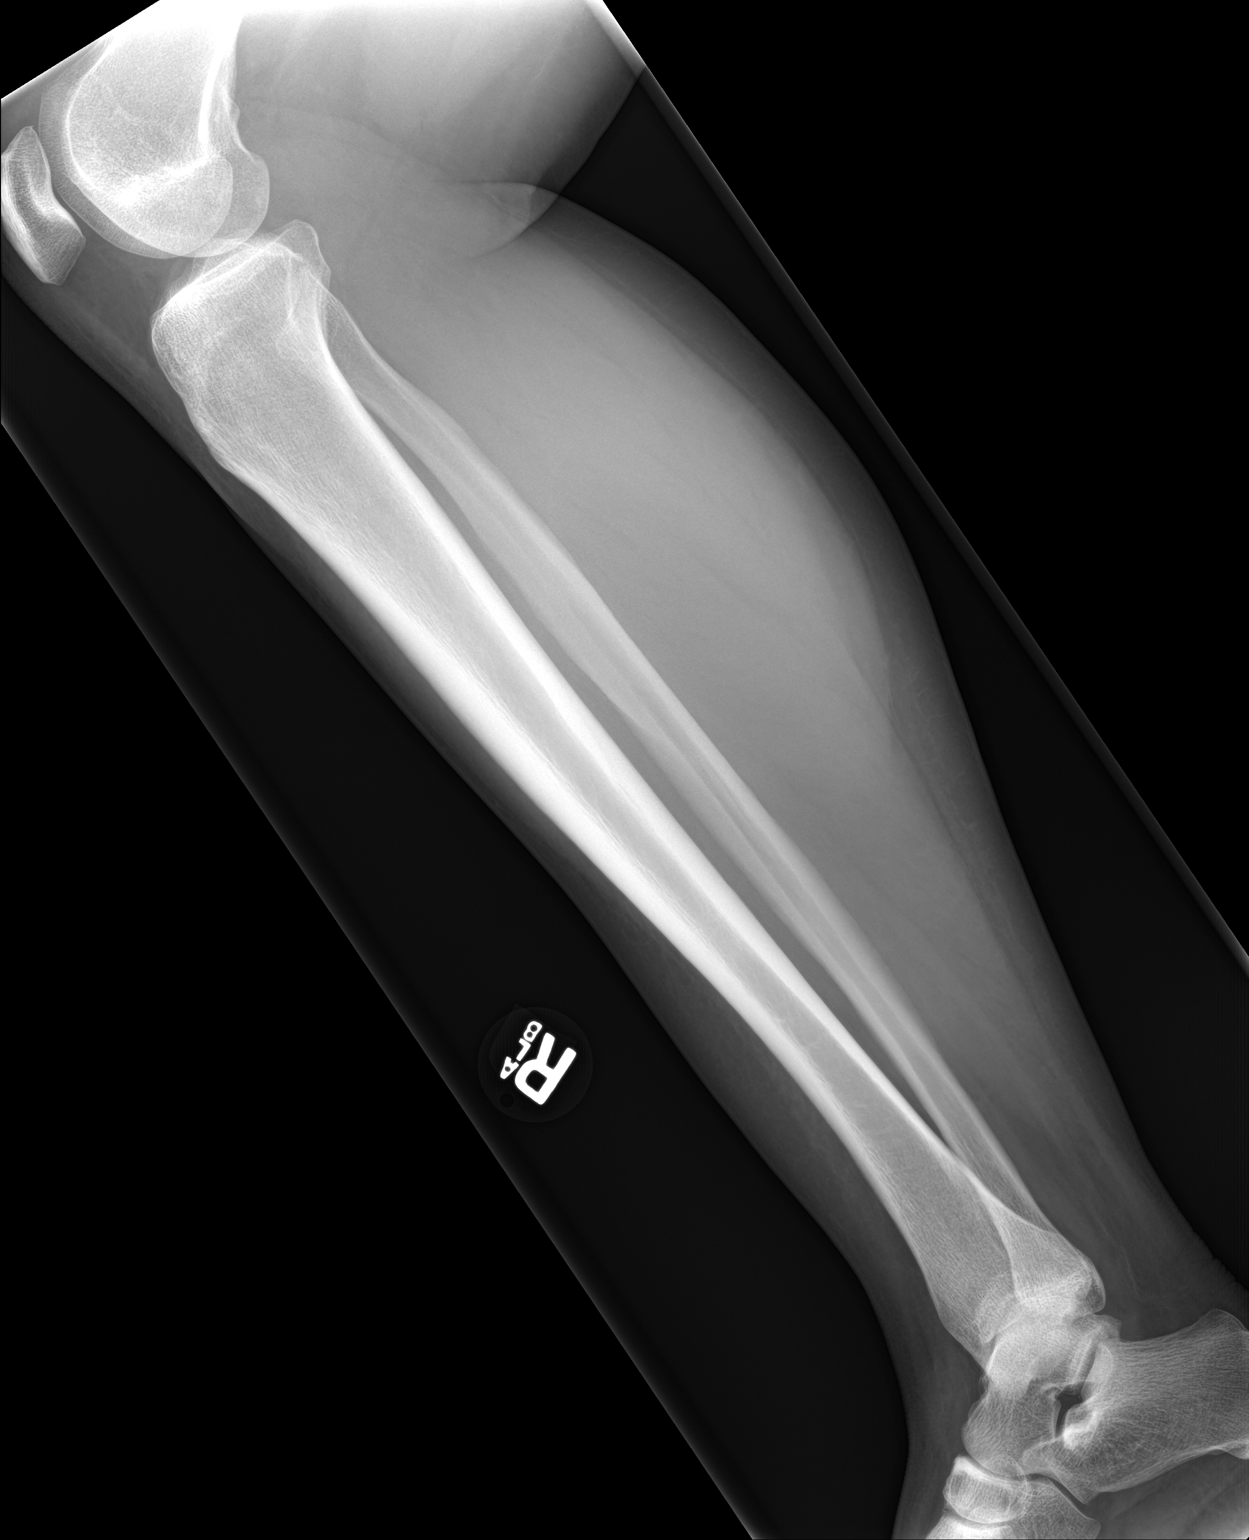

[2 of 2 positions shown; findings below may reference images not displayed]

FINDINGS: Normal bone mineralization. Tiny chronic well corticated ossicle
just distal to the fibula. No acute fracture or dislocation.
IMPRESSION: No significant abnormality within the tibia or fibula. Well
corticated ossicle just distal to the fibula, likely the sequela of
remote trauma.

## 2022-08-18 DIAGNOSIS — M4856XA Collapsed vertebra, not elsewhere classified, lumbar region, initial encounter for fracture: Secondary | ICD-10-CM | POA: Insufficient documentation

## 2022-08-19 ENCOUNTER — Other Ambulatory Visit: Payer: Self-pay | Admitting: Sports Medicine

## 2022-08-19 DIAGNOSIS — M47816 Spondylosis without myelopathy or radiculopathy, lumbar region: Secondary | ICD-10-CM

## 2022-08-19 DIAGNOSIS — S32030S Wedge compression fracture of third lumbar vertebra, sequela: Secondary | ICD-10-CM

## 2022-08-19 DIAGNOSIS — G8929 Other chronic pain: Secondary | ICD-10-CM

## 2022-08-19 DIAGNOSIS — M545 Low back pain, unspecified: Secondary | ICD-10-CM

## 2022-08-19 DIAGNOSIS — S32010S Wedge compression fracture of first lumbar vertebra, sequela: Secondary | ICD-10-CM

## 2022-08-26 ENCOUNTER — Ambulatory Visit
Admission: RE | Admit: 2022-08-26 | Discharge: 2022-08-26 | Disposition: A | Discharge status: Home / Self Care | Payer: 59 | Source: Ambulatory Visit | Attending: Sports Medicine | Admitting: Sports Medicine

## 2022-08-26 DIAGNOSIS — M47816 Spondylosis without myelopathy or radiculopathy, lumbar region: Secondary | ICD-10-CM

## 2022-08-26 DIAGNOSIS — M545 Low back pain, unspecified: Secondary | ICD-10-CM | POA: Insufficient documentation

## 2022-08-26 DIAGNOSIS — M533 Sacrococcygeal disorders, not elsewhere classified: Secondary | ICD-10-CM | POA: Diagnosis present

## 2022-08-26 DIAGNOSIS — S32010S Wedge compression fracture of first lumbar vertebra, sequela: Secondary | ICD-10-CM | POA: Diagnosis present

## 2022-08-26 DIAGNOSIS — S32030S Wedge compression fracture of third lumbar vertebra, sequela: Secondary | ICD-10-CM | POA: Diagnosis present

## 2022-08-26 DIAGNOSIS — G8929 Other chronic pain: Secondary | ICD-10-CM | POA: Diagnosis present

## 2022-09-03 ENCOUNTER — Ambulatory Visit: Payer: 59 | Admitting: Family Medicine

## 2022-12-09 ENCOUNTER — Ambulatory Visit (INDEPENDENT_AMBULATORY_CARE_PROVIDER_SITE_OTHER): Payer: Managed Care, Other (non HMO) | Admitting: Podiatry

## 2022-12-09 DIAGNOSIS — B351 Tinea unguium: Secondary | ICD-10-CM | POA: Diagnosis not present

## 2022-12-09 MED ORDER — ONDANSETRON HCL 4 MG PO TABS: 4.0000 mg | ORAL_TABLET | Freq: Three times a day (TID) | ORAL | 0 refills | Status: DC | PRN

## 2022-12-09 MED ORDER — TERBINAFINE HCL 250 MG PO TABS: 250.0000 mg | ORAL_TABLET | Freq: Every day | ORAL | 0 refills | Status: DC

## 2022-12-09 NOTE — Progress Notes (Signed)
   Chief Complaint  Patient presents with   Nail Problem    Nail fungus, follow-up TX: Lamisil, Lotrisone cream    Subjective: 37 y.o. female presenting today for follow-up evaluation of thickening with discoloration to the toenails of the right foot that is been ongoing for few years now.  She also gets dry cracking skin which itches occasionally.  This been present for a few years now.    Since last visit she has noticed significant improvement of the condition of her skin as well as her toenails.  Patient completed the oral Lamisil that was prescribed.  She did have some mild nausea associated with the pill but was able to tolerate it well.  She states that the pharmacy only prescribed her 6 weeks worth of the antifungal medication.  Today her toenails are painted with a gel lacquer and I am unable to visualize them completely.  Presenting for follow-up treatment and evaluation  Past Medical History:  Diagnosis Date   Cancer [facility])    Cervical dysplasia 2007   LEEP   Colitis    Depression    Diabetes mellitus without complication (HCC)    Migraine    1x/mo    Past Surgical History:  Procedure Laterality Date   DILATION AND CURETTAGE OF UTERUS     HERNIA REPAIR     LEEP  2007   TUBAL LIGATION      Allergies  Allergen Reactions   Benadryl [Diphenhydramine] Hives   Ibuprofen Hives   Lactose Intolerance (Gi) Diarrhea    RT foot 06/06/2022  Objective: Physical Exam General: The patient is alert and oriented x3 in no acute distress.  Dermatology: Nail lacquer noted to the toenail plate of the feet bilateral.  Unable to fully evaluate the nails.  Skin normal.  Complete resolution of the pruritus and hyperkeratosis of the skin.  Vascular: Palpable pedal pulses bilaterally. No edema or erythema noted. Capillary refill within normal limits.  Neurological: Light touch and protective threshold grossly intact bilaterally.   Musculoskeletal Exam: No pedal deformity  noted  Assessment: #1 Onychomycosis of toenails LT  Plan of Care:  #1 Patient was evaluated. #2  Today we discussed different treatment options including oral, topical, and laser antifungal treatment modalities.  We discussed their efficacies and side effects.  Patient opts for oral antifungal treatment modality #3  Refill prescription for Lamisil 250 mg #90 daily.  Patient did not complete the full 90 days in January.  Pt denies a history of liver pathology or symptoms.  Patient is otherwise healthy #4  Prescription for Zofran 4 mg as needed nausea and vomiting associated with the oral Lamisil  #5 return to clinic 6 months   Felecia Shelling, DPM [facility]  Dr. Felecia Shelling, DPM    [address] [place], [place] [address]                Office [phone]  Fax [phone]

## 2023-01-06 ENCOUNTER — Ambulatory Visit
Admission: RE | Admit: 2023-01-06 | Discharge: 2023-01-06 | Disposition: A | Discharge status: Home / Self Care | Payer: Managed Care, Other (non HMO) | Source: Ambulatory Visit | Attending: Family Medicine | Admitting: Family Medicine

## 2023-01-06 VITALS — BP 127/85 | HR 84 | Temp 99.0°F | Resp 18

## 2023-01-06 DIAGNOSIS — Z20822 Contact with and (suspected) exposure to covid-19: Secondary | ICD-10-CM | POA: Insufficient documentation

## 2023-01-06 DIAGNOSIS — J069 Acute upper respiratory infection, unspecified: Secondary | ICD-10-CM | POA: Insufficient documentation

## 2023-01-06 LAB — POCT RAPID STREP A (OFFICE): Rapid Strep A Screen: NEGATIVE

## 2023-01-06 LAB — SARS CORONAVIRUS 2 (TAT 6-24 HRS): SARS Coronavirus 2: NEGATIVE

## 2023-01-06 NOTE — ED Triage Notes (Addendum)
Patient to Urgent Care with complaints of dry cough/ sore throat/ nasal congestion.  Symptoms started two days ago. Also diarrhea x2 days. No known fevers but has felt feverish.  Using Alker-seltzer cold and flu, cough drops/ honey/ Pepto/ tums.

## 2023-01-06 NOTE — ED Provider Notes (Signed)
Desiree Green    CSN: [id number] Arrival date & time: 01/06/23  1012     History   Chief Complaint Chief Complaint  Patient presents with   Cough    Sore throat, congestion - Entered by patient    HPI Desiree Green is a 37 y.o. female.   HPI Patient presents for evaluation of a cough, congestion, sore throat,  Past Medical History:  Diagnosis Date   Cancer (HCC)    Cervical dysplasia 2007   LEEP   Colitis    Depression    Diabetes mellitus without complication (HCC)    Migraine    1x/mo    Patient Active Problem List   Diagnosis Date Noted   Tenosynovitis of extensor hallucis longus tendon 08/08/2021   Obesity (BMI 30-39.9) 06/24/2021   Menorrhagia with regular cycle 04/30/2021   Cervical cancer (HCC) 12/19/2019   Metrorrhagia 02/24/2018   Overweight (BMI 25.0-29.9) 02/24/2018   Headache 06/22/2017   Umbilical hernia 06/22/2017   Disorder of sacrum 04/21/2012   Low back pain 04/21/2012    Past Surgical History:  Procedure Laterality Date   DILATION AND CURETTAGE OF UTERUS     HERNIA REPAIR     LEEP  2007   TUBAL LIGATION      OB History     Gravida  4   Para  2   Term  2   Preterm      AB  2   Living  2      SAB  1   IAB      Ectopic  1   Multiple      Live Births               Home Medications    Prior to Admission medications   Medication Sig Start Date End Date Taking? Authorizing Provider  albuterol (VENTOLIN HFA) 108 (90 Base) MCG/ACT inhaler SMARTSIG:2 Puff(s) By Mouth Every 4 Hours PRN 05/04/21   [provider]  benzonatate (TESSALON) 100 MG capsule Take 1 capsule (100 mg total) by mouth 3 (three) times daily as needed. Patient not taking: Reported on 01/06/2023 08/01/22   Margaretann Loveless, PA-C  clotrimazole-betamethasone (LOTRISONE) cream Apply 1 Application topically 2 (two) times daily. 06/09/22   Felecia Shelling, DPM  metFORMIN (GLUCOPHAGE-XR) 500 MG 24 hr tablet Take 1 tablet (500 mg total) by  mouth daily with breakfast. Patient not taking: Reported on 07/30/2022 01/28/22   Dana Allan, MD  ondansetron (ZOFRAN) 4 MG tablet Take 1 tablet (4 mg total) by mouth every 8 (eight) hours as needed for nausea or vomiting. 12/09/22   Felecia Shelling, DPM  predniSONE (DELTASONE) 20 MG tablet Take 2 tablets (40 mg total) by mouth daily with breakfast. Patient not taking: Reported on 01/06/2023 08/01/22   Margaretann Loveless, PA-C  promethazine-dextromethorphan (PROMETHAZINE-DM) 6.25-15 MG/5ML syrup Take 5 mLs by mouth 4 (four) times daily as needed. Patient not taking: Reported on 01/06/2023 08/01/22   Margaretann Loveless, PA-C  rizatriptan (MAXALT) 10 MG tablet Take 1 tablet (10 mg total) by mouth as needed for migraine. May repeat in 2 hours if needed; this is a 30 day supply Patient not taking: Reported on 07/30/2022 05/06/21 08/08/21  Margaretann Loveless, PA-C  rizatriptan (MAXALT) 10 MG tablet Take 10 mg by mouth as needed for migraine. May repeat in 2 hours if needed    [provider]  terbinafine (LAMISIL) 250 MG tablet Take 1 tablet (250 mg  total) by mouth daily. 12/09/22   Felecia Shelling, DPM    Family History Family History  Problem Relation Age of Onset   Breast cancer Maternal Aunt    Hypertension Father    Ovarian cancer Sister    Melanoma Maternal Grandmother    Colon cancer Paternal Grandfather    Lung cancer Paternal Grandfather    Thyroid disease Mother     Social History Social History   Tobacco Use   Smoking status: Some Days    Current packs/day: 0.00    Average packs/day: 1 pack/day for 16.0 years (16.0 ttl pk-yrs)    Types: Cigarettes    Start date: 05/07/2002    Last attempt to quit: 05/07/2018    Years since quitting: 4.6   Smokeless tobacco: Never   Tobacco comments:    started age 44.    Vaping Use   Vaping status: Never Used  Substance Use Topics   Alcohol use: Not Currently    Comment: rarely   Drug use: No     Allergies   Benadryl  [diphenhydramine], Ibuprofen, and Lactose intolerance (gi)   Review of Systems Review of Systems Pertinent negatives listed in HPI   Physical Exam Triage Vital Signs ED Triage Vitals  Encounter Vitals Group     BP 01/06/23 1027 127/85     Systolic BP Percentile --      Diastolic BP Percentile --      Pulse Rate 01/06/23 1027 84     Resp 01/06/23 1027 18     Temp 01/06/23 1027 99 F (37.2 C)     Temp src --      SpO2 01/06/23 1027 95 %     Weight --      Height --      Head Circumference --      Peak Flow --      Pain Score 01/06/23 1037 6     Pain Loc --      Pain Education --      Exclude from Growth Chart --    No data found.  Updated Vital Signs BP 127/85   Pulse 84   Temp 99 F (37.2 C)   Resp 18   LMP 01/05/2023   SpO2 95%   Visual Acuity Right Eye Distance:   Left Eye Distance:   Bilateral Distance:    Right Eye Near:   Left Eye Near:    Bilateral Near:     Physical Exam General Appearance:    Alert, cooperative, no distress  HENT:  Normocephalic,  sinus tenderness, both sides TM normal without fluid or infection, neck has both sides anterior cervical nodes enlarged, throat normal without erythema or exudate,  post nasal drip noted, and nasal mucosa congested  Eyes:    PERRL, conjunctiva/corneas clear, EOM's intact       Lungs:     Clear to auscultation bilaterally, respirations unlabored  Heart:    Regular rate and rhythm  Neurologic:   Awake, alert, oriented x 3. No apparent focal neurological           defect.         UC Treatments / Results  Labs (all labs ordered are listed, but only abnormal results are displayed) Labs Reviewed  SARS CORONAVIRUS 2 (TAT 6-24 HRS)  POCT RAPID STREP A (OFFICE)    EKG   Radiology No results found.  Procedures Procedures (including critical care time)  Medications Ordered in UC Medications - No data to  display  Initial Impression / Assessment and Plan / UC Course  I have reviewed the triage vital  signs and the nursing notes.  Pertinent labs & imaging results that were available during my care of the patient were reviewed by me and considered in my medical decision making (see chart for details).    Viral URI, cough, symptom management indicated only. COVID test pending. Our office will contact you directly if your test result is positive.  All results are viewable on MyChart. Final Clinical Impressions(s) / UC Diagnoses   Final diagnoses:  Encounter for laboratory testing for COVID-19 virus  Viral URI with cough     Discharge Instructions      COVID test will result within 24 hoursCOVID test will result within 24 hours.  Our office will only call if your results are positive.  All results will update to your MyChart.  Continue with symptomatic management of symptoms as you are symptoms appear to be viral. Continue Tylenol or ibuprofen as needed for fever and bodyaches.  Return for evaluation as needed.        ED Prescriptions   None    PDMP not reviewed this encounter.   Bing Neighbors, NP 01/09/23 [phone]

## 2023-01-06 NOTE — Discharge Instructions (Signed)
COVID test will result within 24 hoursCOVID test will result within 24 hours.  Our office will only call if your results are positive.  All results will update to your MyChart.  Continue with symptomatic management of symptoms as you are symptoms appear to be viral. Continue Tylenol or ibuprofen as needed for fever and bodyaches.  Return for evaluation as needed.

## 2023-01-17 ENCOUNTER — Other Ambulatory Visit: Payer: Self-pay | Admitting: Physician Assistant

## 2023-01-17 DIAGNOSIS — G43809 Other migraine, not intractable, without status migrainosus: Secondary | ICD-10-CM

## 2023-01-19 MED ORDER — RIZATRIPTAN BENZOATE 10 MG PO TABS: 10.0000 mg | ORAL_TABLET | ORAL | 0 refills | Status: DC | PRN

## 2023-02-11 ENCOUNTER — Encounter: Payer: 59 | Admitting: Family Medicine

## 2023-02-17 ENCOUNTER — Encounter: Payer: 59 | Admitting: Family Medicine

## 2023-02-25 ENCOUNTER — Encounter: Payer: 59 | Admitting: Family Medicine

## 2023-03-05 ENCOUNTER — Ambulatory Visit: Payer: 59 | Admitting: Family Medicine

## 2023-03-05 VITALS — BP 112/70 | HR 80 | Temp 97.9°F | Resp 16 | Ht 65.0 in | Wt 192.2 lb

## 2023-03-05 DIAGNOSIS — G43109 Migraine with aura, not intractable, without status migrainosus: Secondary | ICD-10-CM | POA: Diagnosis not present

## 2023-03-05 DIAGNOSIS — E669 Obesity, unspecified: Secondary | ICD-10-CM | POA: Diagnosis not present

## 2023-03-05 DIAGNOSIS — F39 Unspecified mood [affective] disorder: Secondary | ICD-10-CM | POA: Diagnosis not present

## 2023-03-05 MED ORDER — PANTOPRAZOLE SODIUM 40 MG PO TBEC
40.0000 mg | DELAYED_RELEASE_TABLET | Freq: Every day | ORAL | 0 refills | Status: DC
Start: 2023-03-05 — End: 2023-06-22

## 2023-03-05 MED ORDER — KETOROLAC TROMETHAMINE 60 MG/2ML IM SOLN
30.0000 mg | Freq: Once | INTRAMUSCULAR | Status: AC
Start: 2023-03-05 — End: 2023-03-05
  Administered 2023-03-05: 30 mg via INTRAMUSCULAR

## 2023-03-05 MED ORDER — KETOROLAC TROMETHAMINE 30 MG/ML IJ SOLN
30.0000 mg | Freq: Once | INTRAMUSCULAR | Status: DC
Start: 2023-03-05 — End: 2023-03-05

## 2023-03-05 MED ORDER — NAPROXEN 500 MG PO TABS
500.0000 mg | ORAL_TABLET | Freq: Two times a day (BID) | ORAL | 0 refills | Status: DC
Start: 2023-03-05 — End: 2023-09-23

## 2023-03-05 MED ORDER — ESCITALOPRAM OXALATE 5 MG PO TABS
5.0000 mg | ORAL_TABLET | Freq: Every day | ORAL | 0 refills | Status: DC
Start: 2023-03-05 — End: 2023-04-10

## 2023-03-05 MED ORDER — NURTEC 75 MG PO TBDP: 75.0000 mg | ORAL_TABLET | ORAL | Status: DC | PRN

## 2023-03-05 NOTE — Patient Instructions (Addendum)
It was a pleasure meeting you today. Thank you for allowing me to take part in your health care.  Our goals for today as we discussed include:  Toradol 30 mg today Can take Naproxen 8 hours after   Try Nurtec 75 mg for headache if Toradol not relieving headache in 30 mins.  Can only take 1 tablet in 24 hours. Sample pack provided x 1  Start Lexapro 5 mg daily   [facility]  [address]  [place] [address] [phone]    [email or web address] [address], [place] [phone]   [email or web address]  [email or web address]  For Mental Health Concerns  [facility] [phone] Address: [address] [place], [place] [address] Hours: Open 24/7, No appointment required.    Follow up in 1 week   If you have any questions or concerns, please do not hesitate to call the office at [phone].  I look forward to our next visit and until then take care and stay safe.  Regards,   Dana Allan, MD   [facility]

## 2023-03-05 NOTE — Progress Notes (Addendum)
SUBJECTIVE:   Chief Complaint  Patient presents with   Migraine    Has had a headache every day this week   HPI Presents to clinic for acute visit  Discussed the use of AI scribe software for clinical note transcription with the patient, who gave verbal consent to proceed.  History of Present Illness The patient, with a history of migraines, presented with a severe headache that started a few days prior to the consultation. The headache was described as a migraine, which was unresponsive to her usual treatment regimen of Maxalt and Excedrin. The patient reported that the frequency of migraines had increased recently, with the last episode occurring about a month or two prior to the current episode. Her migraines typically occur once or twice a year.  The onset of the current migraine coincided with the end of her menstrual cycle. Accompanying symptoms included nausea and a jerking motion in one hand, which are typical for her migraines. The patient denied experiencing any numbness, tingling in the arms or legs, or slurred speech.  The patient also reported significant stress and anxiety, with recent panic attacks. She is currently going through a divorce, which has been a major source of stress. The patient is not currently on any medication for stress and has not sought therapy. The patient endorses having suicidal thoughts but has no active plan and reports thoughts of her kids help her through.  Denies any previous attempts or hospitalization. Open to medication for mood disorder.  The patient also reported fasting in the mornings and maintaining good fluid intake. She has previously been on metformin for prediabetes, but had to stop due to severe side effects. The patient is also trying to lose weight.  The patient's migraines are typically triggered by stress and are usually relieved by a dark room, ice packs, and no noise. She has tried Aleve and Excedrin for pain relief, but these  have not been effective for the current episode. The patient has previously tried Imitrex, but it was not effective. Maxalt usually works for her migraines, but it has not been effective for the current episode.     PERTINENT PMH / PSH: As above  OBJECTIVE:  BP 112/70   Pulse 80   Temp 97.9 F (36.6 C)   Resp 16   Ht 5\' 5"  (1.651 m)   Wt 192 lb 4 oz (87.2 kg)   LMP 02/24/2023   SpO2 99%   BMI 31.99 kg/m    Physical Exam Vitals reviewed.  Constitutional:      General: She is not in acute distress.    Appearance: She is not ill-appearing.  HENT:     Head: Normocephalic.     Right Ear: Tympanic membrane, ear canal and external ear normal.     Left Ear: Tympanic membrane, ear canal and external ear normal.     Nose: Nose normal.     Mouth/Throat:     Mouth: Mucous membranes are moist.  Eyes:     Extraocular Movements: Extraocular movements intact.     Conjunctiva/sclera: Conjunctivae normal.     Pupils: Pupils are equal, round, and reactive to light.  Neck:     Vascular: No carotid bruit.  Cardiovascular:     Rate and Rhythm: Normal rate and regular rhythm.     Pulses: Normal pulses.     Heart sounds: Normal heart sounds.  Pulmonary:     Effort: Pulmonary effort is normal.     Breath sounds: Normal breath  sounds.  Abdominal:     General: Bowel sounds are normal. There is no distension.     Palpations: Abdomen is soft.     Tenderness: There is no abdominal tenderness. There is no right CVA tenderness, left CVA tenderness, guarding or rebound.  Musculoskeletal:        General: Normal range of motion.     Cervical back: Normal range of motion.     Right lower leg: No edema.     Left lower leg: No edema.  Lymphadenopathy:     Cervical: No cervical adenopathy.  Skin:    Capillary Refill: Capillary refill takes less than 2 seconds.  Neurological:     General: No focal deficit present.     Mental Status: She is alert and oriented to person, place, and time. Mental  status is at baseline.     Motor: No weakness.  Psychiatric:        Mood and Affect: Mood normal.        Behavior: Behavior normal.        Thought Content: Thought content normal.        Judgment: Judgment normal.        03/05/2023    1:46 PM 01/28/2022    2:54 PM 08/08/2021   11:41 AM 06/04/2021    3:45 PM  Depression screen PHQ 2/9  Decreased Interest 3 0 0 0  Down, Depressed, Hopeless 3 1 0 0  PHQ - 2 Score 6 1 0 0  Altered sleeping 3  1 3   Tired, decreased energy 3  1 1   Change in appetite 3  0 2  Feeling bad or failure about yourself  3  0 1  Trouble concentrating 2  0 0  Moving slowly or fidgety/restless 2  0 1  Suicidal thoughts 2  0 0  PHQ-9 Score 24  2 8   Difficult doing work/chores Very difficult  Not difficult at all Not difficult at all      03/05/2023    1:47 PM 08/08/2021   11:41 AM 06/04/2021    3:46 PM  GAD 7 : Generalized Anxiety Score  Nervous, Anxious, on Edge 3 2 3   Control/stop worrying 3 1 3   Worry too much - different things 3 2 3   Trouble relaxing 3 2 3   Restless 3 1 2   Easily annoyed or irritable 3 1 2   Afraid - awful might happen 3 0 0  Total GAD 7 Score 21 9 16   Anxiety Difficulty Very difficult Not difficult at all Not difficult at all    ASSESSMENT/PLAN:  Migraine with aura and without status migrainosus, not intractable Assessment & Plan: Persistent headache since Monday, not responsive to Maxalt or Excedrin. Associated with nausea and hand jerking, which is typical for her migraines. No new neurological symptoms. -Administer Toradol injection today. -Prescribe Naproxen for future migraines, to be taken with Protonix to protect the stomach. -Consider Nurtec if Toradol does not provide relief.  Orders: -     Pantoprazole Sodium; Take 1 tablet (40 mg total) by mouth daily.  Dispense: 30 tablet; Refill: 0 -     Naproxen; Take 1 tablet (500 mg total) by mouth 2 (two) times daily with a meal.  Dispense: 60 tablet; Refill: 0 -     Ketorolac  Tromethamine -     Nurtec; Take 1 tablet (75 mg total) by mouth as needed.  Mood disorder [facility]) Assessment & Plan: Recent increase in stress and anxiety due to personal issues,  leading to panic attacks.Difficulty sleeping, possibly related to stress and anxiety. MDQ 9 screening negative -Start Lexapro 5mg  daily, monitor for side effects and efficacy -Encourage increased dose of Melatonin for sleep aid. -Schedule follow-up in 2 weeks to assess response to medication. -Encourage use of online therapy resources ([email or web address], [email or web address]). -Refer to psychiatry  Orders: -     Escitalopram Oxalate; Take 1 tablet (5 mg total) by mouth daily.  Dispense: 30 tablet; Refill: 0 -     Ambulatory referral to Psychiatry  Obesity (BMI 30-39.9) Assessment & Plan: Previous use of Metformin for weight loss, but discontinued due to side effects. -Continue current weight management strategies.      PDMP reviewed  Return in about 1 week (around 03/12/2023) for PCP.  Dana Allan, MD

## 2023-03-13 ENCOUNTER — Encounter: Payer: Self-pay | Admitting: Family Medicine

## 2023-03-13 DIAGNOSIS — F39 Unspecified mood [affective] disorder: Secondary | ICD-10-CM | POA: Insufficient documentation

## 2023-03-13 NOTE — Assessment & Plan Note (Signed)
Persistent headache since Monday, not responsive to Maxalt or Excedrin. Associated with nausea and hand jerking, which is typical for her migraines. No new neurological symptoms. -Administer Toradol injection today. -Prescribe Naproxen for future migraines, to be taken with Protonix to protect the stomach. -Consider Nurtec if Toradol does not provide relief.

## 2023-03-13 NOTE — Assessment & Plan Note (Addendum)
Recent increase in stress and anxiety due to personal issues, leading to panic attacks.Difficulty sleeping, possibly related to stress and anxiety. MDQ 9 screening negative -Start Lexapro 5mg  daily, monitor for side effects and efficacy -Encourage increased dose of Melatonin for sleep aid. -Schedule follow-up in 2 weeks to assess response to medication. -Encourage use of online therapy resources ([email or web address], [email or web address]). -Refer to psychiatry

## 2023-03-13 NOTE — Assessment & Plan Note (Signed)
Previous use of Metformin for weight loss, but discontinued due to side effects. -Continue current weight management strategies.

## 2023-03-20 ENCOUNTER — Ambulatory Visit: Payer: 59 | Admitting: Family Medicine

## 2023-03-27 ENCOUNTER — Telehealth: Payer: Managed Care, Other (non HMO) | Admitting: Family Medicine

## 2023-03-27 DIAGNOSIS — J069 Acute upper respiratory infection, unspecified: Secondary | ICD-10-CM

## 2023-03-27 MED ORDER — PSEUDOEPH-BROMPHEN-DM 30-2-10 MG/5ML PO SYRP
5.0000 mL | ORAL_SOLUTION | Freq: Four times a day (QID) | ORAL | 0 refills | Status: DC | PRN
Start: 2023-03-27 — End: 2023-06-22

## 2023-03-27 MED ORDER — FLUTICASONE PROPIONATE 50 MCG/ACT NA SUSP
2.0000 | Freq: Every day | NASAL | 0 refills | Status: DC
Start: 2023-03-27 — End: 2023-07-28

## 2023-03-27 NOTE — Progress Notes (Signed)

## 2023-04-10 ENCOUNTER — Other Ambulatory Visit: Payer: Self-pay | Admitting: Family Medicine

## 2023-04-10 DIAGNOSIS — F39 Unspecified mood [affective] disorder: Secondary | ICD-10-CM

## 2023-04-13 ENCOUNTER — Other Ambulatory Visit: Payer: Self-pay

## 2023-04-22 ENCOUNTER — Other Ambulatory Visit: Payer: Self-pay | Admitting: Family Medicine

## 2023-04-22 DIAGNOSIS — F39 Unspecified mood [affective] disorder: Secondary | ICD-10-CM

## 2023-06-02 ENCOUNTER — Ambulatory Visit: Payer: Managed Care, Other (non HMO) | Admitting: Podiatry

## 2023-06-09 ENCOUNTER — Ambulatory Visit (INDEPENDENT_AMBULATORY_CARE_PROVIDER_SITE_OTHER): Payer: 59 | Admitting: Podiatry

## 2023-06-09 ENCOUNTER — Encounter: Payer: Self-pay | Admitting: Podiatry

## 2023-06-09 DIAGNOSIS — B351 Tinea unguium: Secondary | ICD-10-CM | POA: Diagnosis not present

## 2023-06-09 NOTE — Progress Notes (Signed)
   Chief Complaint  Patient presents with   Nail Problem    They're about the same.  They're not as dark but they're about the same.    Subjective: 38 y.o. female presenting today for follow-up evaluation of thickening with discoloration to the toenails of the right foot that is been ongoing for few years now.  Patient has completed oral Lamisil  and she has not noticed any improvement.  Presenting for further treatment and evaluation  Past Medical History:  Diagnosis Date   Cancer [facility])    Cervical dysplasia 2007   LEEP   Colitis    Depression    Diabetes mellitus without complication (HCC)    Migraine    1x/mo    Past Surgical History:  Procedure Laterality Date   DILATION AND CURETTAGE OF UTERUS     HERNIA REPAIR     LEEP  2007   TUBAL LIGATION      Allergies  Allergen Reactions   Benadryl [Diphenhydramine] Hives   Ibuprofen  Hives   Lactose Intolerance (Gi) Diarrhea    RT foot 06/06/2022  Objective: Physical Exam General: The patient is alert and oriented x3 in no acute distress.  Dermatology: Hyperkeratotic dystrophic friable nails noted especially to the right hallux nail plate.  Essentially unchanged from 1 year ago despite oral Lamisil   Vascular: Palpable pedal pulses bilaterally. No edema or erythema noted. Capillary refill within normal limits.  Neurological: Grossly intact via light touch  Musculoskeletal Exam: No pedal deformity noted  Assessment: #1 Onychomycosis of toenails LT  Plan of Care:  -Patient was evaluated. -The patient completed 90 days of oral Lamisil .  She continues to have nail dystrophy with friable nails especially to the right hallux nail plate. -Decision was made today to harvest a nail biopsy using a nail nipper which was taken and sent to pathology for fungal culture -Will plan to contact the patient once results are available to discuss further treatment options   Thresa EMERSON Sar, DPM [facility]  Dr. Thresa EMERSON Sar, DPM    [address] [place], [place] [address]                Office [phone]  Fax [phone]

## 2023-06-22 ENCOUNTER — Ambulatory Visit (INDEPENDENT_AMBULATORY_CARE_PROVIDER_SITE_OTHER): Payer: Managed Care, Other (non HMO) | Admitting: Gastroenterology

## 2023-06-22 ENCOUNTER — Encounter: Payer: Self-pay | Admitting: Gastroenterology

## 2023-06-22 VITALS — BP 143/80 | HR 76 | Temp 98.3°F | Ht 65.0 in | Wt 192.1 lb

## 2023-06-22 DIAGNOSIS — K625 Hemorrhage of anus and rectum: Secondary | ICD-10-CM

## 2023-06-22 DIAGNOSIS — R1084 Generalized abdominal pain: Secondary | ICD-10-CM | POA: Diagnosis not present

## 2023-06-22 DIAGNOSIS — R933 Abnormal findings on diagnostic imaging of other parts of digestive tract: Secondary | ICD-10-CM

## 2023-06-22 DIAGNOSIS — R194 Change in bowel habit: Secondary | ICD-10-CM | POA: Diagnosis not present

## 2023-06-22 DIAGNOSIS — R109 Unspecified abdominal pain: Secondary | ICD-10-CM

## 2023-06-22 DIAGNOSIS — R195 Other fecal abnormalities: Secondary | ICD-10-CM

## 2023-06-22 NOTE — Progress Notes (Signed)
Arlyss Repress, MD [address]  [address]  [place], [place] [address]  Main: [phone]  Fax: [phone]    Gastroenterology Consultation  Referring Provider:     Dana Allan, MD Primary Care Physician:  Dana Allan, MD Primary Gastroenterologist:  Dr. Arlyss Repress Reason for Consultation:     Abdominal cramps, altered bowel habits        HPI:   Desiree Green is a 38 y.o. female referred by Dr. Dana Allan, MD  for consultation & management of chronic history of lower GI symptoms including generalized abdominal cramps, frequent bowel movements associated with rectal bleeding. This started approximately 2-3 years ago after her umbilical hernia repair and have been gradually getting worse. She does report urgency and sometimes nocturnal symptoms. She does report intermittent episodes of constipation about once a week that last for 2-3 days. She reports bloating. She denies weight loss, in fact gained a few pounds. She does consume diet sodas about 2 per day and chews gum all day at work. 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 She had cross sectional imaging about 3 weeks ago when she presented to the ER with flareup of above symptoms. This showed mild thickening in left colon and therefore referred here further evaluation. Patient reports that her regular doctor never referred her to gastroenterologist and was suggested to try some herbal supplements in the past. Apparently, patient has been dealing with recurrent sinus infections and on several different antibiotics within last 1 year. She has history of cervical and uterine cancer, underwent D&C/LEEP procedure. She reports that her cancer is in remission  History of present illness 06/22/2023 Desiree Green is here for ongoing symptoms of lower abdominal cramps associated with loose stools and abdominal bloating.  This has been ongoing for several years, last seen by me in 2019 when I  recommended eating healthy as well as undergo colonoscopy which she did not pursue.  She was on Wegovy which resulted in about 20 pound weight loss, her insurance stopped covering for it and she regained her weight back.  She lost about 8 to 10 pounds by cutting back on rice and wheat products on her own.  She denies any rectal bleeding, reports heavy menstrual cycles, last hemoglobin 10.9 in 04/2021.  Trying to find an OB/GYN to undergo uterine ablation.  Leads a sedentary lifestyle, consumes red meat regularly and past that is her favorite  NSAIDs: None  Antiplts/Anticoagulants/Anti thrombotics: None  GI Procedures: None Denies family history of IBD or GI malignancy  Denies any GI surgeries   Past Medical History:  Diagnosis Date   Cancer [facility])    Cervical dysplasia 2007   LEEP   Colitis    Depression    Diabetes mellitus without complication (HCC)    Migraine    1x/mo    Past Surgical History:  Procedure Laterality Date   DILATION AND CURETTAGE OF UTERUS     HERNIA REPAIR     LEEP  2007   TUBAL LIGATION       Current Outpatient Medications:    albuterol (VENTOLIN HFA) 108 (90 Base) MCG/ACT inhaler, SMARTSIG:2 Puff(s) By Mouth Every 4 Hours PRN, Disp: , Rfl:    fluticasone (FLONASE) 50 MCG/ACT nasal spray, Place 2 sprays into both nostrils daily., Disp: 16 g, Rfl: 0   naproxen (NAPROSYN) 500 MG tablet, Take 1 tablet (500 mg total) by mouth 2 (two) times daily with a  meal., Disp: 60 tablet, Rfl: 0   ondansetron (ZOFRAN) 4 MG tablet, Take 1 tablet (4 mg total) by mouth every 8 (eight) hours as needed for nausea or vomiting., Disp: 30 tablet, Rfl: 0   Family History  Problem Relation Age of Onset   Breast cancer Maternal Aunt    Hypertension Father    Ovarian cancer Sister    Melanoma Maternal Grandmother    Colon cancer Paternal Grandfather    Lung cancer Paternal Grandfather    Thyroid disease Mother      Social History   Tobacco Use   Smoking status: Every Day     Current packs/day: 0.25    Average packs/day: 1 pack/day for 16.8 years (16.2 ttl pk-yrs)    Types: Cigarettes    Start date: 05/07/2002    Last attempt to quit: 05/07/2018   Smokeless tobacco: Never   Tobacco comments:    started age 65.    Vaping Use   Vaping status: Never Used  Substance Use Topics   Alcohol use: Not Currently    Comment: rarely   Drug use: No    Allergies as of 06/22/2023 - Review Complete 06/22/2023  Allergen Reaction Noted   Benadryl [diphenhydramine] Hives 07/25/2016   Ibuprofen Hives 02/19/2016   Lactose intolerance (gi) Diarrhea 06/25/2017    Review of Systems:    All systems reviewed and negative except where noted in HPI.   Physical Exam:  BP (!) 143/80 (BP Location: Right Arm, Patient Position: Sitting, Cuff Size: Normal)   Pulse 76   Temp 98.3 F (36.8 C) (Oral)   Ht 5\' 5"  (1.651 m)   Wt 192 lb 2 oz (87.1 kg)   LMP 05/07/2023 (Approximate)   BMI 31.97 kg/m  Patient's last menstrual period was 05/07/2023 (approximate).  General:   Alert,  Well-developed, well-nourished, pleasant and cooperative in NAD Head:  Normocephalic and atraumatic. Eyes:  Sclera clear, no icterus.   Conjunctiva pink. Ears:  Normal auditory acuity. Nose:  No deformity, discharge, or lesions. Mouth:  No deformity or lesions,oropharynx pink & moist. Neck:  Supple; no masses or thyromegaly. Lungs:  Respirations even and unlabored.  Clear throughout to auscultation.   No wheezes, crackles, or rhonchi. No acute distress. Heart:  Regular rate and rhythm; no murmurs, clicks, rubs, or gallops. Abdomen:  Normal bowel sounds. Soft, non-tender and non-distended without masses, hepatosplenomegaly or hernias noted.  No guarding or rebound tenderness.   Rectal: Not performed Msk:  Symmetrical without gross deformities. Good, equal movement & strength bilaterally. Pulses:  Normal pulses noted. Extremities:  No clubbing or edema.  No cyanosis. Neurologic:  Alert and oriented x3;   grossly normal neurologically. Skin:  Intact without significant lesions or rashes. No jaundice. Psych:  Alert and cooperative. Normal mood and affect.  Imaging Studies: Reviewed   Assessment and Plan:   Fany Eigsti is a 38 y.o. female with history of cervical and uterine cancer s/p LEEP, chronic history of abdominal cramps, altered bowel habits and intermittent rectal bleeding, CT on 2 separate occasions revealed left colon thickening in 2019.  Check food allergy profile, alpha gal panel Celiac disease panel Fecal calprotectin levels Discussed about healthy eating habits and exercise at least 3 to 5 days a week  Follow up based on the above workup  Arlyss Repress, MD

## 2023-06-26 ENCOUNTER — Other Ambulatory Visit: Payer: Self-pay | Admitting: Podiatry

## 2023-07-01 ENCOUNTER — Other Ambulatory Visit: Payer: Self-pay | Admitting: Podiatry

## 2023-07-01 ENCOUNTER — Telehealth: Payer: Self-pay | Admitting: Podiatry

## 2023-07-01 DIAGNOSIS — Z79899 Other long term (current) drug therapy: Secondary | ICD-10-CM

## 2023-07-01 NOTE — Progress Notes (Signed)
Tried contacting the patient regarding fungal nail biopsy.  Positive for onychomycosis. Recommend an additional 90 days of Lamisil 250mg  #90 daily after updated hepatic function panel.  Order placed.  Felecia Shelling, DPM [facility]  Dr. Felecia Shelling, DPM    [address] [place], [place] [address]                Office [phone]  Fax [phone]

## 2023-07-01 NOTE — Telephone Encounter (Signed)
Error. Previous note documented today regarding fungal nail biopsy results  Felecia Shelling, DPM [facility]  Dr. Felecia Shelling, DPM    [address] [place], [place] [address]                Office [phone]  Fax [phone]

## 2023-07-02 LAB — CELIAC DISEASE PANEL
Endomysial IgA: NEGATIVE
IgA/Immunoglobulin A, Serum: 122 mg/dL (ref 87–352)

## 2023-07-02 LAB — ALPHA-GAL PANEL: IgE (Immunoglobulin E), Serum: 10 [IU]/mL (ref 6–495)

## 2023-07-02 LAB — FOOD ALLERGY PROFILE

## 2023-07-06 ENCOUNTER — Encounter: Payer: Self-pay | Admitting: Gastroenterology

## 2023-07-06 ENCOUNTER — Encounter: Payer: Self-pay | Admitting: Podiatry

## 2023-07-28 ENCOUNTER — Ambulatory Visit (INDEPENDENT_AMBULATORY_CARE_PROVIDER_SITE_OTHER): Payer: Managed Care, Other (non HMO)

## 2023-07-28 ENCOUNTER — Ambulatory Visit: Payer: Managed Care, Other (non HMO) | Admitting: Nurse Practitioner

## 2023-07-28 ENCOUNTER — Telehealth: Payer: Self-pay

## 2023-07-28 VITALS — BP 116/80 | HR 87 | Temp 98.5°F | Ht 65.0 in | Wt 192.8 lb

## 2023-07-28 DIAGNOSIS — R509 Fever, unspecified: Secondary | ICD-10-CM

## 2023-07-28 DIAGNOSIS — U071 COVID-19: Secondary | ICD-10-CM

## 2023-07-28 DIAGNOSIS — R52 Pain, unspecified: Secondary | ICD-10-CM | POA: Diagnosis not present

## 2023-07-28 DIAGNOSIS — R5383 Other fatigue: Secondary | ICD-10-CM | POA: Diagnosis not present

## 2023-07-28 LAB — POCT INFLUENZA A/B
Influenza A, POC: NEGATIVE
Influenza B, POC: NEGATIVE

## 2023-07-28 MED ORDER — PREDNISONE 10 MG PO TABS: ORAL_TABLET | ORAL | 0 refills | Status: DC

## 2023-07-28 MED ORDER — AMOXICILLIN-POT CLAVULANATE 875-125 MG PO TABS: 1.0000 | ORAL_TABLET | Freq: Two times a day (BID) | ORAL | 0 refills | Status: DC

## 2023-07-28 MED ORDER — HYDROCOD POLI-CHLORPHE POLI ER 10-8 MG/5ML PO SUER
5.0000 mL | Freq: Every evening | ORAL | 0 refills | Status: DC | PRN
Start: 2023-07-28 — End: 2023-12-29

## 2023-07-28 NOTE — Telephone Encounter (Signed)
 Left message to call back before her appointment. Desiree Green would like for me to ask her some questions.

## 2023-07-28 NOTE — Progress Notes (Signed)
 The chest x-ray is clear.  No infection or mass seen on the x-ray.

## 2023-07-28 NOTE — Progress Notes (Signed)
 Established Patient Office Visit  Subjective:  Patient ID: Desiree Green, female    DOB: December 17, 1985  Age: 38 y.o. MRN: [id number]  CC:  Chief Complaint  Patient presents with   Acute Visit    Covid positive  Sore throat, runny nose, sinus pressure, lots of mucus, fever, chills, body aches, chest tightness & SOB on exertion, extremely exhausted and rash in mouth by throat Started late on 07/24/23   Discussed the use of a AI scribe software for clinical note transcription with the patient, who gave verbal consent to proceed.  HPI  Desiree Green presents for acute visit following a positive home COVID test. Symptoms began last Thursday with what felt like a sinus infection, including drainage and a very sore throat, initially suspected to be strep throat. Over the following days, chills, body aches, and a fluctuating fever developed, peaking at 103F on Saturday or Sunday, and fluctuating around 100F since. A positive COVID-19 test was obtained last night after worsening symptoms.  Current symptoms include a cough that started yesterday, shortness of breath with excretion, rib pain due to coughing, extreme exhaustion, and yellow- green phlegm.  Current medications include DayQuil tablets, Aleve for pain, a nasal spray, and zinc tablets. She is allergic to ibuprofen and finds Tylenol ineffective. She uses a humidifier with Vicks at night. She recently had a video visit through her insurance, where she was prescribed Teslon pearls. She also mentions a rash in her mouth and some ear pressure, though no ear pain.   HPI   Past Medical History:  Diagnosis Date   Cancer [facility])    Cervical dysplasia 2007   LEEP   Colitis    Depression    Diabetes mellitus without complication (HCC)    Migraine    1x/mo    Past Surgical History:  Procedure Laterality Date   DILATION AND CURETTAGE OF UTERUS     HERNIA REPAIR     LEEP  2007   TUBAL LIGATION      Family History  Problem  Relation Age of Onset   Breast cancer Maternal Aunt    Hypertension Father    Ovarian cancer Sister    Melanoma Maternal Grandmother    Colon cancer Paternal Grandfather    Lung cancer Paternal Grandfather    Thyroid disease Mother     Social History   Socioeconomic History   Marital status: Married    Spouse name: Not on file   Number of children: Not on file   Years of education: Not on file   Highest education level: 12th grade  Occupational History   Not on file  Tobacco Use   Smoking status: Every Day    Current packs/day: 0.25    Average packs/day: 1 pack/day for 16.9 years (16.2 ttl pk-yrs)    Types: Cigarettes    Start date: 05/07/2002    Last attempt to quit: 05/07/2018   Smokeless tobacco: Never   Tobacco comments:    started age 44.    Vaping Use   Vaping status: Never Used  Substance and Sexual Activity   Alcohol use: Not Currently    Comment: rarely   Drug use: No   Sexual activity: Yes  Other Topics Concern   Not on file  Social History Narrative   Not on file   Social Drivers of Health   Financial Resource Strain: High Risk (07/28/2023)   Overall Financial Resource Strain (CARDIA)    Difficulty of Paying Living Expenses: Very  hard  Food Insecurity: Food Insecurity Present (07/28/2023)   Hunger Vital Sign    Worried About Running Out of Food in the Last Year: Often true    Ran Out of Food in the Last Year: Often true  Transportation Needs: No Transportation Needs (07/28/2023)   PRAPARE - Administrator, Civil Service (Medical): No    Lack of Transportation (Non-Medical): No  Physical Activity: Unknown (07/28/2023)   Exercise Vital Sign    Days of Exercise per Week: Patient declined    Minutes of Exercise per Session: Not on file  Stress: Stress Concern Present (07/28/2023)   [employer] of Occupational Health - Occupational Stress Questionnaire    Feeling of Stress : Very much  Social Connections: Socially Isolated (07/28/2023)    Social Connection and Isolation Panel [NHANES]    Frequency of Communication with Friends and Family: Three times a week    Frequency of Social Gatherings with Friends and Family: Twice a week    Attends Religious Services: Never    Database administrator or Organizations: No    Attends Engineer, structural: Not on file    Marital Status: Separated  Intimate Partner Violence: Not on file     Outpatient Medications Prior to Visit  Medication Sig Dispense Refill   albuterol (VENTOLIN HFA) 108 (90 Base) MCG/ACT inhaler SMARTSIG:2 Puff(s) By Mouth Every 4 Hours PRN     naproxen (NAPROSYN) 500 MG tablet Take 1 tablet (500 mg total) by mouth 2 (two) times daily with a meal. 60 tablet 0   fluticasone (FLONASE) 50 MCG/ACT nasal spray Place 2 sprays into both nostrils daily. 16 g 0   ondansetron (ZOFRAN) 4 MG tablet Take 1 tablet (4 mg total) by mouth every 8 (eight) hours as needed for nausea or vomiting. 30 tablet 0   No facility-administered medications prior to visit.    Allergies  Allergen Reactions   Benadryl [Diphenhydramine] Hives   Ibuprofen Hives   Lactose Intolerance (Gi) Diarrhea    ROS Review of Systems Negative unless indicated in HPI.    Objective:    Physical Exam  BP 116/80   Pulse 87   Temp 98.5 F (36.9 C)   Ht 5\' 5"  (1.651 m)   Wt 192 lb 12.8 oz (87.5 kg)   SpO2 98%   BMI 32.08 kg/m  Wt Readings from Last 3 Encounters:  07/28/23 192 lb 12.8 oz (87.5 kg)  06/22/23 192 lb 2 oz (87.1 kg)  03/05/23 192 lb 4 oz (87.2 kg)     Health Maintenance  Topic Date Due   Pneumococcal Vaccine 64-48 Years old (1 of 2 - PCV) Never done   Hepatitis C Screening  Never done   COVID-19 Vaccine (2 - Janssen risk series) 10/05/2019   INFLUENZA VACCINE  08/31/2023 (Originally 01/01/2023)   DTaP/Tdap/Td (2 - Td or Tdap) 12/02/2030   HIV Screening  Completed   HPV VACCINES  Aged Out    There are no preventive care reminders to display for this patient.  Lab  Results  Component Value Date   TSH 2.020 04/30/2021   Lab Results  Component Value Date   WBC 5.3 09/15/2019   HGB 12.4 09/15/2019   HCT 39.6 09/15/2019   MCV 82.8 09/15/2019   PLT 236 09/15/2019   Lab Results  Component Value Date   NA 137 09/15/2019   K 4.2 09/15/2019   CO2 25 09/15/2019   GLUCOSE 98 09/15/2019   BUN 10  09/15/2019   CREATININE 0.72 09/15/2019   BILITOT 0.9 06/09/2017   ALKPHOS 45 06/09/2017   AST 21 06/09/2017   ALT 15 06/09/2017   PROT 7.1 06/09/2017   ALBUMIN 4.1 06/09/2017   CALCIUM 9.4 09/15/2019   ANIONGAP 9 09/15/2019   Lab Results  Component Value Date   CHOL 178 04/30/2021   Lab Results  Component Value Date   HDL 46 04/30/2021   Lab Results  Component Value Date   LDLCALC 95 04/30/2021   Lab Results  Component Value Date   TRIG 215 (H) 04/30/2021   Lab Results  Component Value Date   CHOLHDL 3.9 04/30/2021   Lab Results  Component Value Date   HGBA1C 5.8 (H) 04/30/2021      Assessment & Plan:  Positive self-administered antigen test for COVID-19 Assessment & Plan: Positive home test with symptoms of fever, body aches, cough, and shortness of breath. Concern for possible secondary bacterial infection. Negative for flu. -Order chest x-ray to rule out pneumonia. -Start Augmentin to cover possible secondary bacterial infection. -Start Prednisone to reduce inflammation. -Advise to continue over-the-counter medications:Robitussin, DayQuil, Aleve, nasal spray, and zinc tablets. -Add Mucinex to help with phlegm and congestion. -Prescribe cough medication with codeine for nighttime use. -Advise to seek emergency care if shortness of breath or chest pain worsens. -Follow-up as needed.  Orders: -     DG Chest 2 View  Fever, unspecified fever cause -     POCT Influenza A/B -     DG Chest 2 View  Body aches -     POCT Influenza A/B  Fatigue, unspecified type -     POCT Influenza A/B  Other orders -     predniSONE; Take  4 tablets ( total 40 mg) by mouth for 2 days; take 3 tablets ( total 30 mg) by mouth for 2 days; take 2 tablets ( total 20 mg) by mouth for 1 day; take 1 tablet ( total 10 mg) by mouth for 1 day.  Dispense: 17 tablet; Refill: 0 -     Hydrocod Poli-Chlorphe Poli ER; Take 5 mLs by mouth at bedtime as needed.  Dispense: 70 mL; Refill: 0 -     Amoxicillin-Pot Clavulanate; Take 1 tablet by mouth 2 (two) times daily.  Dispense: 20 tablet; Refill: 0    Follow-up: No follow-ups on file.   Kara Dies, NP

## 2023-07-29 ENCOUNTER — Ambulatory Visit: Payer: Managed Care, Other (non HMO) | Admitting: Gastroenterology

## 2023-08-05 NOTE — Assessment & Plan Note (Signed)
 Positive home test with symptoms of fever, body aches, cough, and shortness of breath. Concern for possible secondary bacterial infection. Negative for flu. -Order chest x-ray to rule out pneumonia. -Start Augmentin to cover possible secondary bacterial infection. -Start Prednisone to reduce inflammation. -Advise to continue over-the-counter medications:Robitussin, DayQuil, Aleve, nasal spray, and zinc tablets. -Add Mucinex to help with phlegm and congestion. -Prescribe cough medication with codeine for nighttime use. -Advise to seek emergency care if shortness of breath or chest pain worsens. -Follow-up as needed.

## 2023-08-10 ENCOUNTER — Other Ambulatory Visit: Payer: Self-pay

## 2023-08-10 ENCOUNTER — Other Ambulatory Visit (HOSPITAL_COMMUNITY): Payer: Self-pay

## 2023-08-10 ENCOUNTER — Telehealth: Payer: Self-pay

## 2023-08-10 NOTE — Telephone Encounter (Signed)
 Pharmacy Patient Advocate Encounter   Received notification from CoverMyMeds that prior authorization for Hydrocod Poli-Chlorphe Poli ER 10-8MG /5ML er suspension is required/requested.   Insurance verification completed.   The patient is insured through [employer] .   Per test claim: PA required; PA submitted to above mentioned insurance via CoverMyMeds Key/confirmation #/EOC  [id number] Status is pending

## 2023-08-10 NOTE — Telephone Encounter (Signed)
 Pharmacy Patient Advocate Encounter  Received notification from [employer] that Prior Authorization for Hydrocod Poli-Chlorphe Poli ER 10-8MG /5ML er suspension has been APPROVED from 08/10/23 to 11/08/23. Ran test claim, Copay is $10. This test claim was processed through [facility]- copay amounts may vary at other pharmacies due to pharmacy/plan contracts, or as the patient moves through the different stages of their insurance plan.   PA #/Case ID/Reference #: [id number]

## 2023-09-23 ENCOUNTER — Other Ambulatory Visit: Payer: Self-pay | Admitting: Family Medicine

## 2023-09-23 DIAGNOSIS — G43109 Migraine with aura, not intractable, without status migrainosus: Secondary | ICD-10-CM

## 2023-09-23 MED ORDER — NAPROXEN 500 MG PO TABS
500.0000 mg | ORAL_TABLET | Freq: Two times a day (BID) | ORAL | 0 refills | Status: AC
Start: 2023-09-23 — End: ?

## 2023-12-25 ENCOUNTER — Ambulatory Visit: Payer: Self-pay | Admitting: Family Medicine

## 2023-12-29 ENCOUNTER — Telehealth: Admitting: Physician Assistant

## 2023-12-29 DIAGNOSIS — R1030 Lower abdominal pain, unspecified: Secondary | ICD-10-CM

## 2023-12-29 DIAGNOSIS — R197 Diarrhea, unspecified: Secondary | ICD-10-CM

## 2023-12-29 NOTE — Progress Notes (Signed)
  Because of symptoms > 1 week and concern for flare of chronic issue, I feel your condition warrants further evaluation and I recommend that you be seen in a face-to-face visit.   NOTE: There will be NO CHARGE for this E-Visit   If you are having a true medical emergency, please call 911.     For an urgent face to face visit, [facility] has multiple urgent care centers for your convenience.  Click the link below for the full list of locations and hours, walk-in wait times, appointment scheduling options and driving directions:  Urgent Care - [place], [place], [place], [place], [place], [place]  [place]     Your MyChart E-visit questionnaire answers were reviewed by a board certified advanced clinical practitioner to complete your personal care plan based on your specific symptoms.    Thank you for using e-Visits.

## 2024-01-15 ENCOUNTER — Ambulatory Visit: Admitting: Family Medicine

## 2024-01-15 ENCOUNTER — Encounter: Payer: Self-pay | Admitting: Family Medicine

## 2024-01-15 VITALS — BP 122/81 | HR 70 | Temp 98.2°F | Ht 63.25 in | Wt 193.3 lb

## 2024-01-15 DIAGNOSIS — R7309 Other abnormal glucose: Secondary | ICD-10-CM

## 2024-01-15 DIAGNOSIS — Z136 Encounter for screening for cardiovascular disorders: Secondary | ICD-10-CM

## 2024-01-15 DIAGNOSIS — R519 Headache, unspecified: Secondary | ICD-10-CM

## 2024-01-15 DIAGNOSIS — Z0000 Encounter for general adult medical examination without abnormal findings: Secondary | ICD-10-CM | POA: Diagnosis not present

## 2024-01-15 DIAGNOSIS — F419 Anxiety disorder, unspecified: Secondary | ICD-10-CM

## 2024-01-15 DIAGNOSIS — D509 Iron deficiency anemia, unspecified: Secondary | ICD-10-CM

## 2024-01-15 DIAGNOSIS — Z130 Encounter for screening for diseases of the blood and blood-forming organs and certain disorders involving the immune mechanism: Secondary | ICD-10-CM

## 2024-01-15 DIAGNOSIS — Z716 Tobacco abuse counseling: Secondary | ICD-10-CM

## 2024-01-15 DIAGNOSIS — F330 Major depressive disorder, recurrent, mild: Secondary | ICD-10-CM | POA: Diagnosis not present

## 2024-01-15 DIAGNOSIS — Z202 Contact with and (suspected) exposure to infections with a predominantly sexual mode of transmission: Secondary | ICD-10-CM | POA: Diagnosis not present

## 2024-01-15 DIAGNOSIS — N921 Excessive and frequent menstruation with irregular cycle: Secondary | ICD-10-CM

## 2024-01-15 DIAGNOSIS — R202 Paresthesia of skin: Secondary | ICD-10-CM

## 2024-01-15 DIAGNOSIS — G43109 Migraine with aura, not intractable, without status migrainosus: Secondary | ICD-10-CM

## 2024-01-15 DIAGNOSIS — R195 Other fecal abnormalities: Secondary | ICD-10-CM

## 2024-01-15 DIAGNOSIS — F17200 Nicotine dependence, unspecified, uncomplicated: Secondary | ICD-10-CM

## 2024-01-15 MED ORDER — ESCITALOPRAM OXALATE 5 MG PO TABS: 5.0000 mg | ORAL_TABLET | Freq: Every day | ORAL | 3 refills | Status: DC

## 2024-01-15 NOTE — Progress Notes (Unsigned)
 New patient visit   Patient: Desiree Green   DOB: 1985-10-14   38 y.o. Female  MRN: [id number] Visit Date: 01/15/2024  Today's healthcare provider: LAURAINE LOISE BUOY, DO   Chief Complaint  Patient presents with   Establish Care    Patient presents to establish care with a new pcp.  Patient believes she is having a flare of colitis, is wanting a new referral for GI. Experiencing abd pain, cold sweats, chills, hot flashes, watery stools/ diarrhea, exhaustion/ weakness. Some vomiting/ nausea. Gotten worse over past few weeks.  Patient has hx of migraines, wants to make sure meds stay refilled.  Patient has a new partner, would like hsv and hiv screening, no symptoms    Subjective    Desiree Green is a 38 y.o. female who presents today as a new patient to establish care.   HPI HPI     Establish Care    Additional comments: Patient presents to establish care with a new pcp.  Patient believes she is having a flare of colitis, is wanting a new referral for GI. Experiencing abd pain, cold sweats, chills, hot flashes, watery stools/ diarrhea, exhaustion/ weakness. Some vomiting/ nausea. Gotten worse over past few weeks.  Patient has hx of migraines, wants to make sure meds stay refilled.  Patient has a new partner, would like hsv and hiv screening, no symptoms       Last edited by Cherry Chiquita HERO, CMA on 01/15/2024  3:14 PM.      Desiree Green is a 38 year old female who presents for evaluation of sexually transmitted infections and gastrointestinal symptoms.  She is concerned about potential exposure to sexually transmitted infections (STIs) after being in a new relationship for almost a year following a ten-year marriage. She has not noticed any changes in her health but wants to ensure she is free of STIs. She has a gynecologist appointment at the end of the month for further evaluation.  She has a history of anemia since 2014, following her pregnancy. She experiences  metrorrhagia, which has worsened over time. Previous discussions with her gynecologist included potential treatments such as ablation or hysterectomy, as she has completed her family with two children.  She experiences migraines and previously used Maxalt , which was effective, but was switched to naproxen  due to concerns about tolerance. She takes naproxen  as needed, approximately once a week or every two weeks, but is concerned about overuse. She experiences headaches a couple of times a week, often waking up with them, but does not know if she snores or has sleep apnea.  She has a history of depression and anxiety since her teenage years, which has worsened in the past year or two. She was previously on a low dose of Lexapro , which was effective, but stopped due to a lack of refills after her doctor left. She experienced withdrawal symptoms and is currently in therapy, experiencing fluctuating moods.  She reports gastrointestinal symptoms, including diarrhea, nausea, and vomiting, which have worsened over the past two to three weeks. She was previously diagnosed with colitis in 2018 or 2019 after a scan at urgent care. She has been unable to schedule a colonoscopy and has had negative allergy  panels. She notes that dairy worsens her symptoms.  She experiences numbness and tingling, primarily in her left arm and hand, which has been occurring for less than a year. She is right-handed and describes the sensation as affecting all fingers.  She smokes a quarter  to a half pack of cigarettes a week, having resumed smoking due to stress. She initially quit in 2018 or 2019 but relapsed.     Past Medical History:  Diagnosis Date   Allergy     Anemia    Anxiety    Arthritis    Back/hip   Cancer (HCC)    Cervical dysplasia 2007   LEEP   Colitis    Depression    Diabetes mellitus without complication (HCC)    Migraine    1x/mo   Past Surgical History:  Procedure Laterality Date   DILATION AND  CURETTAGE OF UTERUS     HERNIA REPAIR     LEEP  2007   TUBAL LIGATION     Family Status  Relation Name Status   Mother Katheryn Alive   Father Scientist, forensic   Sister  (Not Specified)   Brother [place] Alive   Daughter [place] Alive   Son Skyline Alive   Mat Aunt Dorothe (Not Specified)   MGM Inocente (Not Specified)   PGF  (Not Specified)  No partnership data on file   Family History  Problem Relation Age of Onset   Thyroid  disease Mother    Alcohol abuse Mother    Anxiety disorder Mother    Depression Mother    Hypertension Father    Alcohol abuse Father    Anxiety disorder Father    Skin cancer Father    Depression Father    Ovarian cancer Sister    Asthma Daughter    ADD / ADHD Son    Breast cancer Maternal Aunt    Cancer Maternal Aunt    Melanoma Maternal Grandmother    Cancer Maternal Grandmother    Diabetes Maternal Grandmother    Colon cancer Paternal Grandfather    Lung cancer Paternal Grandfather    Social History   Socioeconomic History   Marital status: Married    Spouse name: Not on file   Number of children: Not on file   Years of education: Not on file   Highest education level: 12th grade  Occupational History   Not on file  Tobacco Use   Smoking status: Some Days    Current packs/day: 0.25    Average packs/day: 0.9 packs/day for 17.4 years (16.3 ttl pk-yrs)    Types: Cigarettes    Start date: 05/07/2002    Last attempt to quit: 05/07/2018   Smokeless tobacco: Never   Tobacco comments:    started age 64.    Vaping Use   Vaping status: Former  Substance and Sexual Activity   Alcohol use: Not Currently    Comment: rarely   Drug use: Never   Sexual activity: Yes    Birth control/protection: Surgical  Other Topics Concern   Not on file  Social History Narrative   Not on file   Social Drivers of Health   Financial Resource Strain: Medium Risk (01/13/2024)   Overall Financial Resource Strain (CARDIA)    Difficulty of Paying Living Expenses: Somewhat hard   Food Insecurity: Food Insecurity Present (01/13/2024)   Hunger Vital Sign    Worried About Running Out of Food in the Last Year: Sometimes true    Ran Out of Food in the Last Year: Sometimes true  Transportation Needs: No Transportation Needs (01/13/2024)   PRAPARE - Administrator, Civil Service (Medical): No    Lack of Transportation (Non-Medical): No  Physical Activity: Sufficiently Active (01/13/2024)   Exercise Vital Sign  Days of Exercise per Week: 5 days    Minutes of Exercise per Session: 30 min  Stress: Stress Concern Present (01/13/2024)   [employer] of Occupational Health - Occupational Stress Questionnaire    Feeling of Stress: Very much  Social Connections: Socially Isolated (01/13/2024)   Social Connection and Isolation Panel    Frequency of Communication with Friends and Family: More than three times a week    Frequency of Social Gatherings with Friends and Family: More than three times a week    Attends Religious Services: Patient declined    Database administrator or Organizations: No    Attends Engineer, structural: Not on file    Marital Status: Separated   Outpatient Medications Prior to Visit  Medication Sig   naproxen  (NAPROSYN ) 500 MG tablet Take 1 tablet (500 mg total) by mouth 2 (two) times daily with a meal.   No facility-administered medications prior to visit.   Allergies  Allergen Reactions   Benadryl [Diphenhydramine] Hives   Ibuprofen  Hives   Lactose Intolerance (Gi) Diarrhea    Immunization History  Administered Date(s) Administered   Influenza-Unspecified 03/29/2021   Janssen (J&J) SARS-COV-2 Vaccination 09/07/2019   Tdap 12/01/2020    Health Maintenance  Topic Date Due   COVID-19 Vaccine (2 - Janssen risk series) 01/31/2024 (Originally 10/05/2019)   INFLUENZA VACCINE  08/30/2024 (Originally 01/01/2024)   Pneumococcal Vaccine (1 of 2 - PCV) 01/14/2025 (Originally 04/26/2004)   Hepatitis B Vaccines 19-59 Average  Risk (1 of 3 - 19+ 3-dose series) 01/14/2025 (Originally 04/26/2004)   HPV VACCINES (1 - Risk 3-dose SCDM series) 01/14/2025 (Originally 04/26/2012)   DTaP/Tdap/Td (2 - Td or Tdap) 12/02/2030   Hepatitis C Screening  Completed   HIV Screening  Completed   Meningococcal B Vaccine  Aged Out    Patient Care Team: Lauralynn Loeb, Lauraine SAILOR, DO as PCP - General (Family Medicine)  Review of Systems  Constitutional:  Negative for chills, fatigue and fever.  HENT:  Negative for congestion, ear pain, rhinorrhea, sneezing and sore throat.   Eyes: Negative.  Negative for pain and redness.  Respiratory:  Negative for cough, shortness of breath and wheezing.   Cardiovascular:  Negative for chest pain and leg swelling.  Gastrointestinal:  Positive for abdominal pain, blood in stool (small tinge, per pt) and diarrhea (persistent, intermittent and varied based on foods eaten.). Negative for constipation and nausea.  Endocrine: Negative for polydipsia and polyphagia.  Genitourinary: Negative.  Negative for dysuria, flank pain, hematuria, pelvic pain, vaginal bleeding and vaginal discharge.  Musculoskeletal:  Negative for arthralgias, back pain, gait problem and joint swelling.  Skin:  Negative for rash.  Neurological:  Positive for numbness and headaches. Negative for dizziness, tremors, seizures, weakness and light-headedness.  Hematological:  Negative for adenopathy.  Psychiatric/Behavioral: Negative.  Negative for behavioral problems, confusion and dysphoric mood. The patient is not nervous/anxious and is not hyperactive.         Objective    BP 122/81 (BP Location: Left Arm, Patient Position: Sitting, Cuff Size: Normal)   Pulse 70   Temp 98.2 F (36.8 C) (Oral)   Ht 5' 3.25 (1.607 m)   Wt 193 lb 4.8 oz (87.7 kg)   LMP 01/11/2024 (Exact Date)   SpO2 100%   BMI 33.97 kg/m     Physical Exam Vitals and nursing note reviewed.  Constitutional:      General: She is awake.     Appearance: Normal  appearance.  HENT:  Head: Normocephalic and atraumatic.     Right Ear: Tympanic membrane, ear canal and external ear normal.     Left Ear: Tympanic membrane, ear canal and external ear normal.     Nose: Nose normal.     Mouth/Throat:     Mouth: Mucous membranes are moist.     Pharynx: Oropharynx is clear. No oropharyngeal exudate or posterior oropharyngeal erythema.  Eyes:     General: No scleral icterus.    Extraocular Movements: Extraocular movements intact.     Conjunctiva/sclera: Conjunctivae normal.     Pupils: Pupils are equal, round, and reactive to light.  Neck:     Thyroid : No thyromegaly or thyroid  tenderness.  Cardiovascular:     Rate and Rhythm: Normal rate and regular rhythm.     Pulses: Normal pulses.     Heart sounds: Normal heart sounds.  Pulmonary:     Effort: Pulmonary effort is normal. No tachypnea, bradypnea or respiratory distress.     Breath sounds: Normal breath sounds. No stridor. No wheezing, rhonchi or rales.  Abdominal:     General: Bowel sounds are normal. There is no distension.     Palpations: Abdomen is soft. There is no mass.     Tenderness: There is no abdominal tenderness. There is no guarding.     Hernia: No hernia is present.  Musculoskeletal:     Cervical back: Normal range of motion and neck supple.     Right lower leg: No edema.     Left lower leg: No edema.  Lymphadenopathy:     Cervical: No cervical adenopathy.  Skin:    General: Skin is warm and dry.  Neurological:     Mental Status: She is alert and oriented to person, place, and time. Mental status is at baseline.  Psychiatric:        Mood and Affect: Mood normal.        Behavior: Behavior normal.      Depression Screen    01/15/2024    3:09 PM 07/28/2023    2:24 PM 03/05/2023    1:46 PM 01/28/2022    2:54 PM  PHQ 2/9 Scores  PHQ - 2 Score 2 0 6 1  PHQ- 9 Score 9 0 24    Results for orders placed or performed in visit on 01/15/24  HIV antibody (with reflex)  Result  Value Ref Range   HIV Screen 4th Generation wRfx Non Reactive Non Reactive  RPR  Result Value Ref Range   RPR Ser Ql Non Reactive Non Reactive  Comprehensive metabolic panel with GFR  Result Value Ref Range   Glucose 81 70 - 99 mg/dL   BUN 13 6 - 20 mg/dL   Creatinine, Ser 9.14 0.57 - 1.00 mg/dL   eGFR 90 >40 fO/fpw/8.26   BUN/Creatinine Ratio 15 9 - 23   Sodium 138 134 - 144 mmol/L   Potassium 4.1 3.5 - 5.2 mmol/L   Chloride 104 96 - 106 mmol/L   CO2 21 20 - 29 mmol/L   Calcium 9.4 8.7 - 10.2 mg/dL   Total Protein 7.0 6.0 - 8.5 g/dL   Albumin 4.6 3.9 - 4.9 g/dL   Globulin, Total 2.4 1.5 - 4.5 g/dL   Bilirubin Total 0.3 0.0 - 1.2 mg/dL   Alkaline Phosphatase 57 44 - 121 IU/L   AST 16 0 - 40 IU/L   ALT 13 0 - 32 IU/L  Hemoglobin A1c  Result Value Ref Range   Hgb A1c MFr  Bld 5.6 4.8 - 5.6 %   Est. average glucose Bld gHb Est-mCnc 114 mg/dL  Lipid panel  Result Value Ref Range   Cholesterol, Total 185 100 - 199 mg/dL   Triglycerides 815 (H) 0 - 149 mg/dL   HDL 47 >60 mg/dL   VLDL Cholesterol Cal 32 5 - 40 mg/dL   LDL Chol Calc (NIH) 893 (H) 0 - 99 mg/dL   Chol/HDL Ratio 3.9 0.0 - 4.4 ratio  VITAMIN D  25 Hydroxy (Vit-D Deficiency, Fractures)  Result Value Ref Range   Vit D, 25-Hydroxy 15.3 (L) 30.0 - 100.0 ng/mL  TSH Rfx on Abnormal to Free T4  Result Value Ref Range   TSH 1.240 0.450 - 4.500 uIU/mL  HCV Ab w Reflex to Quant PCR  Result Value Ref Range   HCV Ab Non Reactive Non Reactive  Vitamin B12  Result Value Ref Range   Vitamin B-12 231 (L) 232 - 1,245 pg/mL  CBC  Result Value Ref Range   WBC 7.4 3.4 - 10.8 x10E3/uL   RBC 4.35 3.77 - 5.28 x10E6/uL   Hemoglobin 10.5 (L) 11.1 - 15.9 g/dL   Hematocrit 65.3 65.9 - 46.6 %   MCV 80 79 - 97 fL   MCH 24.1 (L) 26.6 - 33.0 pg   MCHC 30.3 (L) 31.5 - 35.7 g/dL   RDW 84.4 (H) 88.2 - 84.5 %   Platelets 321 150 - 450 x10E3/uL  Iron, TIBC and Ferritin Panel  Result Value Ref Range   Total Iron Binding Capacity 474 (H)  250 - 450 ug/dL   UIBC 542 (H) 868 - 574 ug/dL   Iron 17 (L) 27 - 840 ug/dL   Iron Saturation 4 (LL) 15 - 55 %   Ferritin 9 (L) 15 - 150 ng/mL  Interpretation:  Result Value Ref Range   HCV Interp 1: Comment   HSV 1 and 2 Ab, IgG  Result Value Ref Range   HSV 1 Glycoprotein G Ab, IgG Reactive (A) Non Reactive   HSV 2 IgG, Type Spec Reactive (A) Non Reactive    Assessment & Plan     Encounter for medical examination to establish care  Recurrent mild major depressive disorder with anxiety (HCC) -     Escitalopram  Oxalate; Take 1 tablet (5 mg total) by mouth daily.  Dispense: 90 tablet; Refill: 3  Possible exposure to sexually transmitted infection -     HIV Antibody (routine testing w rflx) -     RPR -     HCV Ab w Reflex to Quant PCR -     HSV 1 and 2 Ab, IgG -     Interpretation: -     HSV 1 and 2 Ab, IgG  Microcytic anemia -     CBC -     Iron, TIBC and Ferritin Panel -     Iron, TIBC and Ferritin Panel  Nonintractable episodic headache, unspecified headache type  Paresthesia -     VITAMIN D  25 Hydroxy (Vit-D Deficiency, Fractures) -     TSH Rfx on Abnormal to Free T4 -     Vitamin B12 -     Iron, TIBC and Ferritin Panel  Loose stools -     Ambulatory referral to Gastroenterology  Metrorrhagia  Menorrhagia with irregular cycle  Nicotine dependence with current use  Encounter for smoking cessation counseling  Screening for endocrine, metabolic and immunity disorder -     Comprehensive metabolic panel with GFR  Encounter for screening  for cardiovascular disorders -     Lipid panel  Elevated hemoglobin A1c -     Hemoglobin A1c      Encounter for medical examination to establish care Physical exam overall unremarkable except as noted above. Routine lab work ordered as noted.     Loose stools Loose stools, previously diagnosed with colitis, with exacerbated symptoms, previously negative for alpha-gal, celiac, and food allergies. Dairy worsens  symptoms. Colonoscopy previously recommended but not completed. - Refer to [facility] for further evaluation and management. - Order blood work including hepatitis C screening.  Chronic anemia Chronic anemia likely due to metrorrhagia, no recent blood work since 2021-2022. - Order regular blood work to assess anemia status.  Metrorrhagia; menorrhagia Metrorrhagia with heavy periods contributing to anemia. Previous discussions about ablation or hysterectomy.  Patient has appointment with OB/GYN; defer to specialist management.  Chronic depression and anxiety Chronic depression and anxiety worsened over past year. Lexapro  effective but discontinued. Currently in therapy with variable mood stability. - Prescribe Lexapro  5 mg, 90 tablets. - Advise on tapering strategy if unable to refill at any point in the future.  Non-intractable episodic headache Chronic headaches managed with naproxen  with good relief. Maxalt  preferred but discontinued due to tolerance concerns. Concern about frequent naproxen  use but only uses it once every week or two; discussed that this is okay, as long as it remains effective in treating her migraine.  Nicotine dependence; smoking cessation counseling Current tobacco use resumed due to stress. Previous cessation successful with cold [place] method. - Discuss smoking cessation strategies and encourage quitting.  Paresthesia of left hand Paresthesia in left hand, ongoing for less than a year, no known vitamin deficiencies. - Order blood work to check B12, vitamin D , and TSH levels.    Return in about 1 year (around 01/14/2025) for CPE.     I discussed the assessment and treatment plan with the patient  The patient was provided an opportunity to ask questions and all were answered. The patient agreed with the plan and demonstrated an understanding of the instructions.   The patient was advised to call back or seek an in-person evaluation if the symptoms worsen  or if the condition fails to improve as anticipated.    LAURAINE LOISE BUOY, DO  [facility] [phone] (phone) [phone] (fax)  [facility]

## 2024-01-16 LAB — COMPREHENSIVE METABOLIC PANEL WITH GFR
ALT: 13 IU/L (ref 0–32)
AST: 16 IU/L (ref 0–40)
Albumin: 4.6 g/dL (ref 3.9–4.9)
Alkaline Phosphatase: 57 IU/L (ref 44–121)
BUN/Creatinine Ratio: 15 (ref 9–23)
BUN: 13 mg/dL (ref 6–20)
Bilirubin Total: 0.3 mg/dL (ref 0.0–1.2)
CO2: 21 mmol/L (ref 20–29)
Calcium: 9.4 mg/dL (ref 8.7–10.2)
Chloride: 104 mmol/L (ref 96–106)
Creatinine, Ser: 0.85 mg/dL (ref 0.57–1.00)
Globulin, Total: 2.4 g/dL (ref 1.5–4.5)
Glucose: 81 mg/dL (ref 70–99)
Potassium: 4.1 mmol/L (ref 3.5–5.2)
Sodium: 138 mmol/L (ref 134–144)
Total Protein: 7 g/dL (ref 6.0–8.5)
eGFR: 90 mL/min/1.73 (ref >59)

## 2024-01-16 LAB — VITAMIN D 25 HYDROXY (VIT D DEFICIENCY, FRACTURES): Vit D, 25-Hydroxy: 15.3 ng/mL — ABNORMAL LOW (ref 30.0–100.0)

## 2024-01-16 LAB — CBC
Hematocrit: 34.6 % (ref 34.0–46.6)
Hemoglobin: 10.5 g/dL — ABNORMAL LOW (ref 11.1–15.9)
MCH: 24.1 pg — ABNORMAL LOW (ref 26.6–33.0)
MCHC: 30.3 g/dL — ABNORMAL LOW (ref 31.5–35.7)
MCV: 80 fL (ref 79–97)
Platelets: 321 x10E3/uL (ref 150–450)
RBC: 4.35 x10E6/uL (ref 3.77–5.28)
RDW: 15.5 % — ABNORMAL HIGH (ref 11.7–15.4)
WBC: 7.4 x10E3/uL (ref 3.4–10.8)

## 2024-01-16 LAB — HEMOGLOBIN A1C
Est. average glucose Bld gHb Est-mCnc: 114 mg/dL
Hgb A1c MFr Bld: 5.6 % (ref 4.8–5.6)

## 2024-01-16 LAB — HSV 1 AND 2 AB, IGG
HSV 1 Glycoprotein G Ab, IgG: REACTIVE — AB
HSV 2 IgG, Type Spec: REACTIVE — AB

## 2024-01-16 LAB — IRON,TIBC AND FERRITIN PANEL
Ferritin: 9 ng/mL — ABNORMAL LOW (ref 15–150)
Iron Saturation: 4 % — CL (ref 15–55)
Iron: 17 ug/dL — ABNORMAL LOW (ref 27–159)
Total Iron Binding Capacity: 474 ug/dL — ABNORMAL HIGH (ref 250–450)
UIBC: 457 ug/dL — ABNORMAL HIGH (ref 131–425)

## 2024-01-16 LAB — LIPID PANEL
Chol/HDL Ratio: 3.9 ratio (ref 0.0–4.4)
Cholesterol, Total: 185 mg/dL (ref 100–199)
HDL: 47 mg/dL (ref >39)
LDL Chol Calc (NIH): 106 mg/dL — ABNORMAL HIGH (ref 0–99)
Triglycerides: 184 mg/dL — ABNORMAL HIGH (ref 0–149)
VLDL Cholesterol Cal: 32 mg/dL (ref 5–40)

## 2024-01-16 LAB — HCV INTERPRETATION

## 2024-01-16 LAB — HCV AB W REFLEX TO QUANT PCR: HCV Ab: NONREACTIVE

## 2024-01-16 LAB — VITAMIN B12: Vitamin B-12: 231 pg/mL — ABNORMAL LOW (ref 232–1245)

## 2024-01-16 LAB — HIV ANTIBODY (ROUTINE TESTING W REFLEX): HIV Screen 4th Generation wRfx: NONREACTIVE

## 2024-01-16 LAB — RPR: RPR Ser Ql: NONREACTIVE

## 2024-01-16 LAB — TSH RFX ON ABNORMAL TO FREE T4: TSH: 1.24 u[IU]/mL (ref 0.450–4.500)

## 2024-01-18 ENCOUNTER — Ambulatory Visit: Payer: Self-pay | Admitting: Family Medicine

## 2024-01-18 DIAGNOSIS — E559 Vitamin D deficiency, unspecified: Secondary | ICD-10-CM

## 2024-01-18 DIAGNOSIS — N920 Excessive and frequent menstruation with regular cycle: Secondary | ICD-10-CM

## 2024-01-18 DIAGNOSIS — E538 Deficiency of other specified B group vitamins: Secondary | ICD-10-CM

## 2024-01-18 DIAGNOSIS — D500 Iron deficiency anemia secondary to blood loss (chronic): Secondary | ICD-10-CM

## 2024-01-18 MED ORDER — VITAMIN D (ERGOCALCIFEROL) 1.25 MG (50000 UNIT) PO CAPS: 50000.0000 [IU] | ORAL_CAPSULE | ORAL | 1 refills | Status: DC

## 2024-01-19 ENCOUNTER — Encounter: Payer: Self-pay | Admitting: Family Medicine

## 2024-01-19 DIAGNOSIS — E559 Vitamin D deficiency, unspecified: Secondary | ICD-10-CM

## 2024-01-20 ENCOUNTER — Ambulatory Visit (INDEPENDENT_AMBULATORY_CARE_PROVIDER_SITE_OTHER)

## 2024-01-20 DIAGNOSIS — E538 Deficiency of other specified B group vitamins: Secondary | ICD-10-CM

## 2024-01-20 MED ORDER — CYANOCOBALAMIN 1000 MCG/ML IJ SOLN
1000.0000 ug | Freq: Once | INTRAMUSCULAR | Status: AC
Start: 2024-01-20 — End: 2024-01-20
  Administered 2024-01-20: 1000 ug via INTRAMUSCULAR

## 2024-01-20 MED ORDER — VITAMIN D (ERGOCALCIFEROL) 1.25 MG (50000 UNIT) PO CAPS: 50000.0000 [IU] | ORAL_CAPSULE | ORAL | 1 refills | Status: DC

## 2024-01-20 NOTE — Progress Notes (Unsigned)
 PCP:  Donzella Lauraine SAILOR, DO   No chief complaint on file.    HPI:      [name] is a 38 y.o. [id number] whose LMP was Patient's last menstrual period was 01/11/2024 (exact date)., presents today for her annual examination.  Her menses are {norm/abn:715}, lasting {number: 22536} days.  Dysmenorrhea {dysmen:716}. She {does:18564} have intermenstrual bleeding. Hx of IDA  Sex activity: {sex active: 315163}.  Last Pap: 04/30/21 Results were: no abnormalities /neg HPV DNA  Hx of STDs: {STD hx:14358}  Last mammogram: {date:304500300}  Results were: {norm/abn:13465} There is no FH of breast cancer. There is no FH of ovarian cancer. The patient {does:18564} do self-breast exams.  Tobacco use: {tob:20664} Alcohol use: {Alcohol:11675} No drug use.  Exercise: {exercise:31265}  She {does:18564} get adequate calcium and Vitamin D  in her diet.  Patient Active Problem List   Diagnosis Date Noted   Positive self-administered antigen test for COVID-19 07/28/2023   Mood disorder (HCC) 03/13/2023   Compression fracture of lumbar spine, non-traumatic (HCC) 08/18/2022   Tenosynovitis of extensor hallucis longus tendon 08/08/2021   Obesity (BMI 30-39.9) 06/24/2021   Menorrhagia with regular cycle 04/30/2021   Cervical cancer (HCC) 12/19/2019   Metrorrhagia 02/24/2018   Overweight (BMI 25.0-29.9) 02/24/2018   Migraine 06/22/2017   Umbilical hernia 06/22/2017   Disorder of sacrum 04/21/2012   Low back pain 04/21/2012    Past Surgical History:  Procedure Laterality Date   DILATION AND CURETTAGE OF UTERUS     HERNIA REPAIR     LEEP  2007   TUBAL LIGATION      Family History  Problem Relation Age of Onset   Thyroid  disease Mother    Alcohol abuse Mother    Anxiety disorder Mother    Depression Mother    Hypertension Father    Alcohol abuse Father    Anxiety disorder Father    Skin cancer Father    Depression Father    Ovarian cancer Sister    Asthma Daughter    ADD /  ADHD Son    Breast cancer Maternal Aunt    Cancer Maternal Aunt    Melanoma Maternal Grandmother    Cancer Maternal Grandmother    Diabetes Maternal Grandmother    Colon cancer Paternal Grandfather    Lung cancer Paternal Grandfather     Social History   Socioeconomic History   Marital status: Married    Spouse name: Not on file   Number of children: Not on file   Years of education: Not on file   Highest education level: 12th grade  Occupational History   Not on file  Tobacco Use   Smoking status: Some Days    Current packs/day: 0.25    Average packs/day: 0.9 packs/day for 17.4 years (16.3 ttl pk-yrs)    Types: Cigarettes    Start date: 05/07/2002    Last attempt to quit: 05/07/2018   Smokeless tobacco: Never   Tobacco comments:    started age 66.    Vaping Use   Vaping status: Former  Substance and Sexual Activity   Alcohol use: Not Currently    Comment: rarely   Drug use: Never   Sexual activity: Yes    Birth control/protection: Surgical  Other Topics Concern   Not on file  Social History Narrative   Not on file   Social Drivers of Health   Financial Resource Strain: Medium Risk (01/13/2024)   Overall Financial Resource Strain (CARDIA)  Difficulty of Paying Living Expenses: Somewhat hard  Food Insecurity: Food Insecurity Present (01/13/2024)   Hunger Vital Sign    Worried About Running Out of Food in the Last Year: Sometimes true    Ran Out of Food in the Last Year: Sometimes true  Transportation Needs: No Transportation Needs (01/13/2024)   PRAPARE - Administrator, Civil Service (Medical): No    Lack of Transportation (Non-Medical): No  Physical Activity: Sufficiently Active (01/13/2024)   Exercise Vital Sign    Days of Exercise per Week: 5 days    Minutes of Exercise per Session: 30 min  Stress: Stress Concern Present (01/13/2024)   [employer] of Occupational Health - Occupational Stress Questionnaire    Feeling of Stress: Very much   Social Connections: Socially Isolated (01/13/2024)   Social Connection and Isolation Panel    Frequency of Communication with Friends and Family: More than three times a week    Frequency of Social Gatherings with Friends and Family: More than three times a week    Attends Religious Services: Patient declined    Database administrator or Organizations: No    Attends Engineer, structural: Not on file    Marital Status: Separated  Intimate Partner Violence: Not on file     Current Outpatient Medications:    escitalopram  (LEXAPRO ) 5 MG tablet, Take 1 tablet (5 mg total) by mouth daily., Disp: 90 tablet, Rfl: 3   naproxen  (NAPROSYN ) 500 MG tablet, Take 1 tablet (500 mg total) by mouth 2 (two) times daily with a meal., Disp: 60 tablet, Rfl: 0   Vitamin D , Ergocalciferol , (DRISDOL ) 1.25 MG (50000 UNIT) CAPS capsule, Take 1 capsule (50,000 Units total) by mouth every 7 (seven) days., Disp: 12 capsule, Rfl: 1     ROS:  Review of Systems BREAST: No symptoms   Objective: LMP 01/11/2024 (Exact Date)    OBGyn Exam  Results: No results found for this or any previous visit (from the past 24 hours).  Assessment/Plan: No diagnosis found.  No orders of the defined types were placed in this encounter.            GYN counsel {counseling: 16159}     F/U  No follow-ups on file.  Desiree Green B. Zeriah Baysinger, PA-C 01/20/2024 2:52 PM

## 2024-01-21 ENCOUNTER — Encounter: Payer: Self-pay | Admitting: Obstetrics and Gynecology

## 2024-01-21 ENCOUNTER — Ambulatory Visit (INDEPENDENT_AMBULATORY_CARE_PROVIDER_SITE_OTHER): Admitting: Obstetrics and Gynecology

## 2024-01-21 ENCOUNTER — Other Ambulatory Visit (HOSPITAL_COMMUNITY)
Admission: RE | Admit: 2024-01-21 | Discharge: 2024-01-21 | Disposition: A | Discharge status: Home / Self Care | Source: Ambulatory Visit | Attending: Obstetrics and Gynecology | Admitting: Obstetrics and Gynecology

## 2024-01-21 VITALS — BP 107/73 | HR 76 | Ht 64.5 in | Wt 196.0 lb

## 2024-01-21 DIAGNOSIS — Z803 Family history of malignant neoplasm of breast: Secondary | ICD-10-CM

## 2024-01-21 DIAGNOSIS — D500 Iron deficiency anemia secondary to blood loss (chronic): Secondary | ICD-10-CM

## 2024-01-21 DIAGNOSIS — Z1151 Encounter for screening for human papillomavirus (HPV): Secondary | ICD-10-CM | POA: Insufficient documentation

## 2024-01-21 DIAGNOSIS — Z124 Encounter for screening for malignant neoplasm of cervix: Secondary | ICD-10-CM | POA: Insufficient documentation

## 2024-01-21 DIAGNOSIS — N920 Excessive and frequent menstruation with regular cycle: Secondary | ICD-10-CM

## 2024-01-21 DIAGNOSIS — Z01411 Encounter for gynecological examination (general) (routine) with abnormal findings: Secondary | ICD-10-CM | POA: Diagnosis not present

## 2024-01-21 DIAGNOSIS — R350 Frequency of micturition: Secondary | ICD-10-CM

## 2024-01-21 DIAGNOSIS — Z01419 Encounter for gynecological examination (general) (routine) without abnormal findings: Secondary | ICD-10-CM

## 2024-01-21 LAB — POCT URINALYSIS DIPSTICK
Bilirubin, UA: NEGATIVE
Blood, UA: NEGATIVE
Glucose, UA: NEGATIVE
Ketones, UA: NEGATIVE
Leukocytes, UA: NEGATIVE
Nitrite, UA: NEGATIVE
Protein, UA: NEGATIVE
Spec Grav, UA: 1.02 (ref 1.010–1.025)
Urobilinogen, UA: 0.2 U/dL
pH, UA: 5 (ref 5.0–8.0)

## 2024-01-25 LAB — CYTOLOGY - PAP
Comment: NEGATIVE
Diagnosis: NEGATIVE
Diagnosis: REACTIVE
High risk HPV: NEGATIVE

## 2024-01-27 ENCOUNTER — Ambulatory Visit (INDEPENDENT_AMBULATORY_CARE_PROVIDER_SITE_OTHER)

## 2024-01-27 DIAGNOSIS — E538 Deficiency of other specified B group vitamins: Secondary | ICD-10-CM

## 2024-01-27 MED ORDER — CYANOCOBALAMIN 1000 MCG/ML IJ SOLN
1000.0000 ug | Freq: Once | INTRAMUSCULAR | Status: AC
  Administered 2024-01-27: 1000 ug via INTRAMUSCULAR

## 2024-01-29 ENCOUNTER — Inpatient Hospital Stay

## 2024-01-29 ENCOUNTER — Inpatient Hospital Stay: Attending: Internal Medicine | Admitting: Internal Medicine

## 2024-01-29 ENCOUNTER — Encounter: Payer: Self-pay | Admitting: Internal Medicine

## 2024-01-29 VITALS — BP 123/86 | HR 84 | Temp 98.4°F | Resp 20 | Ht 64.5 in | Wt 196.4 lb

## 2024-01-29 DIAGNOSIS — Z8041 Family history of malignant neoplasm of ovary: Secondary | ICD-10-CM | POA: Diagnosis not present

## 2024-01-29 DIAGNOSIS — Z8541 Personal history of malignant neoplasm of cervix uteri: Secondary | ICD-10-CM | POA: Insufficient documentation

## 2024-01-29 DIAGNOSIS — F17210 Nicotine dependence, cigarettes, uncomplicated: Secondary | ICD-10-CM | POA: Insufficient documentation

## 2024-01-29 DIAGNOSIS — Z79899 Other long term (current) drug therapy: Secondary | ICD-10-CM | POA: Insufficient documentation

## 2024-01-29 DIAGNOSIS — Z808 Family history of malignant neoplasm of other organs or systems: Secondary | ICD-10-CM | POA: Insufficient documentation

## 2024-01-29 DIAGNOSIS — D649 Anemia, unspecified: Secondary | ICD-10-CM | POA: Diagnosis not present

## 2024-01-29 DIAGNOSIS — Z801 Family history of malignant neoplasm of trachea, bronchus and lung: Secondary | ICD-10-CM | POA: Insufficient documentation

## 2024-01-29 DIAGNOSIS — Z8042 Family history of malignant neoplasm of prostate: Secondary | ICD-10-CM | POA: Insufficient documentation

## 2024-01-29 DIAGNOSIS — Z800 Family history of malignant neoplasm of digestive organs: Secondary | ICD-10-CM | POA: Insufficient documentation

## 2024-01-29 DIAGNOSIS — Z803 Family history of malignant neoplasm of breast: Secondary | ICD-10-CM | POA: Diagnosis not present

## 2024-01-29 DIAGNOSIS — N920 Excessive and frequent menstruation with regular cycle: Secondary | ICD-10-CM | POA: Insufficient documentation

## 2024-01-29 DIAGNOSIS — D500 Iron deficiency anemia secondary to blood loss (chronic): Secondary | ICD-10-CM | POA: Diagnosis present

## 2024-01-29 NOTE — Progress Notes (Signed)
 Desiree [name] CONSULT NOTE  Patient Care Team: Pardue, Lauraine SAILOR, DO as PCP - General (Family Medicine) Rennie Desiree SAUNDERS, MD as Consulting Physician (Oncology)  CHIEF COMPLAINTS/PURPOSE OF CONSULTATION: ANEMIA   HEMATOLOGY HISTORY    Latest Reference Range & Units 01/15/24 15:52  Iron 27 - 159 ug/dL 17 (L)  UIBC 868 - 574 ug/dL 542 (H)  TIBC 749 - 549 ug/dL 525 (H)  Ferritin 15 - 150 ng/mL 9 (L)  Iron Saturation 15 - 55 % 4 (LL)  (LL): Data is critically low (L): Data is abnormally low (H): Data is abnormally high # ANEMIA[Hb; MCV-platelets- WBC; Iron sat; ferritin;  GFR- CT/US - ;   HISTORY OF PRESENTING ILLNESS: Patient ambulating-independently .  Alone   Desiree [name] 38 y.o.  female pleasant patient with ? Hx of colitis [undiagnosed] and history of heavy menstrual cycles been referred to us  for further evaluation of anemia.  Patient complains  of excessive fatigue.   Intermittent shortness of breath on exertion.   Blood in stools: intermittent blood in stools in last couple of weeks. EGD/colonoscopy-never awaiting [facility] referral.  Blood in urine: none  Prior blood transfusion: none  Kidney/Liver disease: none  Alcohol: rare Bariatric surgery: none   Vaginal bleeding:  Prior evaluation with hematology: Prior bone marrow biopsy:  Oral iron: intolerance  Prior IV iron infusions: none    Review of Systems  Constitutional:  Positive for malaise/fatigue. Negative for chills, diaphoresis, fever and weight loss.  HENT:  Negative for nosebleeds and sore throat.   Eyes:  Negative for double vision.  Respiratory:  Negative for cough, hemoptysis, sputum production, shortness of breath and wheezing.   Cardiovascular:  Negative for chest pain, palpitations, orthopnea and leg swelling.  Gastrointestinal:  Negative for abdominal pain, blood in stool, constipation, diarrhea, heartburn, melena, nausea and vomiting.  Genitourinary:  Negative for dysuria, frequency  and urgency.  Musculoskeletal:  Negative for back pain and joint pain.  Skin: Negative.  Negative for itching and rash.  Neurological:  Positive for dizziness. Negative for tingling, focal weakness, weakness and headaches.  Endo/Heme/Allergies:  Does not bruise/bleed easily.  Psychiatric/Behavioral:  Negative for depression. The patient is not nervous/anxious and does not have insomnia.      MEDICAL HISTORY:  Past Medical History:  Diagnosis Date   Allergy     Anemia    Anxiety    Arthritis    Back/hip   Cancer (HCC)    Cervical dysplasia 2007   LEEP   Colitis    Depression    Diabetes mellitus without complication (HCC)    Migraine    1x/mo    SURGICAL HISTORY: Past Surgical History:  Procedure Laterality Date   DILATION AND CURETTAGE OF UTERUS     HERNIA REPAIR     LEEP  2007   TUBAL LIGATION      SOCIAL HISTORY: Social History   Socioeconomic History   Marital status: Married    Spouse name: Not on file   Number of children: Not on file   Years of education: Not on file   Highest education level: 12th grade  Occupational History   Not on file  Tobacco Use   Smoking status: Some Days    Current packs/day: 0.25    Average packs/day: 0.9 packs/day for 17.4 years (16.4 ttl pk-yrs)    Types: Cigarettes    Start date: 05/07/2002    Last attempt to quit: 05/07/2018   Smokeless tobacco: Never   Tobacco comments:  started age 71.    Vaping Use   Vaping status: Former  Substance and Sexual Activity   Alcohol use: Yes    Comment: rarely   Drug use: Never   Sexual activity: Yes    Birth control/protection: Surgical    Comment: Tubal Ligation  Other Topics Concern   Not on file  Social History Narrative   Not on file   Social Drivers of Health   Financial Resource Strain: Medium Risk (01/13/2024)   Overall Financial Resource Strain (CARDIA)    Difficulty of Paying Living Expenses: Somewhat hard  Food Insecurity: No Food Insecurity (01/29/2024)   Hunger  Vital Sign    Worried About Running Out of Food in the Last Year: Never true    Ran Out of Food in the Last Year: Never true  Recent Concern: Food Insecurity - Food Insecurity Present (01/13/2024)   Hunger Vital Sign    Worried About Running Out of Food in the Last Year: Sometimes true    Ran Out of Food in the Last Year: Sometimes true  Transportation Needs: No Transportation Needs (01/29/2024)   PRAPARE - Administrator, Civil Service (Medical): No    Lack of Transportation (Non-Medical): No  Physical Activity: Sufficiently Active (01/13/2024)   Exercise Vital Sign    Days of Exercise per Week: 5 days    Minutes of Exercise per Session: 30 min  Stress: Stress Concern Present (01/13/2024)   [employer] of Occupational Health - Occupational Stress Questionnaire    Feeling of Stress: Very much  Social Connections: Socially Isolated (01/13/2024)   Social Connection and Isolation Panel    Frequency of Communication with Friends and Family: More than three times a week    Frequency of Social Gatherings with Friends and Family: More than three times a week    Attends Religious Services: Patient declined    Database administrator or Organizations: No    Attends Engineer, structural: Not on file    Marital Status: Separated  Intimate Partner Violence: Not At Risk (01/29/2024)   Humiliation, Afraid, Rape, and Kick questionnaire    Fear of Current or Ex-Partner: No    Emotionally Abused: No    Physically Abused: No    Sexually Abused: No    FAMILY HISTORY: Family History  Problem Relation Age of Onset   Thyroid  disease Mother    Alcohol abuse Mother    Anxiety disorder Mother    Depression Mother    Hypertension Father    Alcohol abuse Father    Anxiety disorder Father    Skin cancer Father    Depression Father    Prostate cancer Father 68   Ovarian cancer Sister    Breast cancer Maternal Aunt        57s   Melanoma Maternal Grandmother    Diabetes  Maternal Grandmother    Colon cancer Paternal Grandfather    Lung cancer Paternal Grandfather    Asthma Daughter    ADD / ADHD Son     ALLERGIES:  is allergic to benadryl [diphenhydramine], ibuprofen , and lactose intolerance (gi).  MEDICATIONS:  Current Outpatient Medications  Medication Sig Dispense Refill   Cyanocobalamin  (B-12 COMPLIANCE INJECTION IJ) Inject as directed once a week.     escitalopram  (LEXAPRO ) 5 MG tablet Take 1 tablet (5 mg total) by mouth daily. 90 tablet 3   naproxen  (NAPROSYN ) 500 MG tablet Take 1 tablet (500 mg total) by mouth 2 (two) times daily with  a meal. 60 tablet 0   Vitamin D , Ergocalciferol , (DRISDOL ) 1.25 MG (50000 UNIT) CAPS capsule Take 1 capsule (50,000 Units total) by mouth every 7 (seven) days. 12 capsule 1   No current facility-administered medications for this visit.   PHYSICAL EXAMINATION:   Vitals:   01/29/24 1351  BP: 123/86  Pulse: 84  Resp: 20  Temp: 98.4 F (36.9 C)  SpO2: 100%   Filed Weights   01/29/24 1351  Weight: 89.1 kg    Physical Exam Vitals and nursing note reviewed.  HENT:     Head: Normocephalic and atraumatic.     Mouth/Throat:     Pharynx: Oropharynx is clear.  Eyes:     Extraocular Movements: Extraocular movements intact.     Pupils: Pupils are equal, round, and reactive to light.  Cardiovascular:     Rate and Rhythm: Normal rate and regular rhythm.  Pulmonary:     Comments: Decreased breath sounds bilaterally.  Abdominal:     Palpations: Abdomen is soft.  Musculoskeletal:        General: Normal range of motion.     Cervical back: Normal range of motion.  Skin:    General: Skin is warm.  Neurological:     General: No focal deficit present.     Mental Status: She is alert and oriented to person, place, and time.  Psychiatric:        Behavior: Behavior normal.        Judgment: Judgment normal.      LABORATORY DATA:  I have reviewed the data as listed Lab Results  Component Value Date   WBC  7.4 01/15/2024   HGB 10.5 (L) 01/15/2024   HCT 34.6 01/15/2024   MCV 80 01/15/2024   PLT 321 01/15/2024   Recent Labs    01/15/24 1552  NA 138  K 4.1  CL 104  CO2 21  GLUCOSE 81  BUN 13  CREATININE 0.85  CALCIUM 9.4  PROT 7.0  ALBUMIN 4.6  AST 16  ALT 13  ALKPHOS 57  BILITOT 0.3     No results found.  ASSESSMENT & PLAN:   Symptomatic anemia # Anemia- Hb-symptomatic.  Likely due to iron deficiency - from etiology GI blood loss/menorrhagia. AUG 2025-Hb 10.5- ferratin- 5  .  Poor tolerance/lack of improvement on oral iron.    # Discussed regarding IV iron infusion/Venofer. Discussed the potential acute infusion reactions with IV iron; which are quite rare.  Patient understands the risk; will proceed with infusions.   #Etiology of iron deficiency: blood loss- sec to menorrhagia [Dr.Copeland]- ? TAH vs- ablation- not done; awaiting  repeat US - EGD/colonoscopy-never awaiting [facility] referral possible colitis.    # cervical cancer- [at 38 years of age]- [facility]- s/p LEEP- ? RT- clinically NED   Thank you Pardue for allowing me to participate in the care of your pleasant patient. Please do not hesitate to contact me with questions or concerns in the interim.  Tubal ligation # DISPOSITION: # NO labs today # Venofer weekly x 4-  # follow up 3 month- MD; labs- cbc/bmp; iorn studies; ferritin-; possible venofer- Dr.B  All questions were answered. The patient knows to call the clinic with any problems, questions or concerns.    Desiree JONELLE Joe, MD 01/29/2024 3:04 PM

## 2024-01-29 NOTE — Progress Notes (Signed)
 Fatigue/weakness: YES Dyspena: NO  Light headedness: YES Blood in stool: SMALL AMOUNT LIGHT RED

## 2024-01-29 NOTE — Assessment & Plan Note (Addendum)
#   Anemia- Hb-symptomatic.  Likely due to iron deficiency - from etiology GI blood loss/menorrhagia. AUG 2025-Hb 10.5- ferratin- 5  .  Poor tolerance/lack of improvement on oral iron.    # Discussed regarding IV iron infusion/Venofer. Discussed the potential acute infusion reactions with IV iron; which are quite rare.  Patient understands the risk; will proceed with infusions.   #Etiology of iron deficiency: blood loss- sec to menorrhagia [Dr.Copeland]- ? TAH vs- ablation- not done; awaiting  repeat US - EGD/colonoscopy-never awaiting [facility] referral possible colitis.    # cervical cancer- [at 38 years of age]- [facility]- s/p LEEP- ? RT- clinically NED   Thank you Pardue for allowing me to participate in the care of your pleasant patient. Please do not hesitate to contact me with questions or concerns in the interim.  Tubal ligation # DISPOSITION: # NO labs today # Venofer weekly x 4-  # follow up 3 month- MD; labs- cbc/bmp; iorn studies; ferritin-; possible venofer- Dr.B

## 2024-02-03 ENCOUNTER — Ambulatory Visit (INDEPENDENT_AMBULATORY_CARE_PROVIDER_SITE_OTHER)

## 2024-02-03 DIAGNOSIS — E538 Deficiency of other specified B group vitamins: Secondary | ICD-10-CM

## 2024-02-03 MED ORDER — CYANOCOBALAMIN 1000 MCG/ML IJ SOLN
1000.0000 ug | Freq: Once | INTRAMUSCULAR | Status: AC
  Administered 2024-02-03: 1000 ug via INTRAMUSCULAR

## 2024-02-04 NOTE — Progress Notes (Signed)
 Patient here for B12 injection by nursing staff only, for which I was available for consultation if needed.  Patient not seen or evaluated by physician.

## 2024-02-08 ENCOUNTER — Encounter: Payer: Self-pay | Admitting: Family Medicine

## 2024-02-08 ENCOUNTER — Encounter: Admitting: Family Medicine

## 2024-02-08 DIAGNOSIS — E538 Deficiency of other specified B group vitamins: Secondary | ICD-10-CM

## 2024-02-10 ENCOUNTER — Ambulatory Visit (INDEPENDENT_AMBULATORY_CARE_PROVIDER_SITE_OTHER)

## 2024-02-10 DIAGNOSIS — E538 Deficiency of other specified B group vitamins: Secondary | ICD-10-CM

## 2024-02-10 MED ORDER — CYANOCOBALAMIN 1000 MCG/ML IJ SOLN
1000.0000 ug | Freq: Once | INTRAMUSCULAR | Status: AC
  Administered 2024-02-10: 1000 ug via INTRAMUSCULAR

## 2024-02-10 NOTE — Progress Notes (Signed)
 Patient here for B12 injection. Tolerated injection well.

## 2024-02-12 ENCOUNTER — Inpatient Hospital Stay: Attending: Internal Medicine

## 2024-02-12 VITALS — BP 121/78 | HR 70 | Temp 98.2°F | Resp 18

## 2024-02-12 DIAGNOSIS — D500 Iron deficiency anemia secondary to blood loss (chronic): Secondary | ICD-10-CM | POA: Diagnosis present

## 2024-02-12 DIAGNOSIS — Z79899 Other long term (current) drug therapy: Secondary | ICD-10-CM | POA: Insufficient documentation

## 2024-02-12 DIAGNOSIS — N920 Excessive and frequent menstruation with regular cycle: Secondary | ICD-10-CM | POA: Insufficient documentation

## 2024-02-12 DIAGNOSIS — D649 Anemia, unspecified: Secondary | ICD-10-CM

## 2024-02-12 MED ORDER — IRON SUCROSE 20 MG/ML IV SOLN
200.0000 mg | Freq: Once | INTRAVENOUS | Status: AC
  Administered 2024-02-12: 200 mg via INTRAVENOUS
  Filled 2024-02-12: qty 10

## 2024-02-12 NOTE — Patient Instructions (Signed)

## 2024-02-19 ENCOUNTER — Other Ambulatory Visit

## 2024-02-19 ENCOUNTER — Other Ambulatory Visit: Payer: Self-pay | Admitting: *Deleted

## 2024-02-19 ENCOUNTER — Inpatient Hospital Stay

## 2024-02-19 VITALS — BP 116/74 | HR 82 | Temp 98.1°F | Resp 18

## 2024-02-19 DIAGNOSIS — R110 Nausea: Secondary | ICD-10-CM

## 2024-02-19 DIAGNOSIS — D649 Anemia, unspecified: Secondary | ICD-10-CM

## 2024-02-19 DIAGNOSIS — D500 Iron deficiency anemia secondary to blood loss (chronic): Secondary | ICD-10-CM | POA: Diagnosis not present

## 2024-02-19 MED ORDER — ONDANSETRON HCL 8 MG PO TABS: 8.0000 mg | ORAL_TABLET | Freq: Three times a day (TID) | ORAL | 0 refills | Status: AC | PRN

## 2024-02-19 MED ORDER — IRON SUCROSE 20 MG/ML IV SOLN
200.0000 mg | Freq: Once | INTRAVENOUS | Status: AC
  Administered 2024-02-19: 200 mg via INTRAVENOUS
  Filled 2024-02-19: qty 10

## 2024-02-19 MED ORDER — SODIUM CHLORIDE 0.9% FLUSH
10.0000 mL | Freq: Once | INTRAVENOUS | Status: AC | PRN
  Administered 2024-02-19: 10 mL
  Filled 2024-02-19: qty 10

## 2024-02-26 ENCOUNTER — Inpatient Hospital Stay

## 2024-02-26 VITALS — BP 122/87 | HR 79 | Temp 97.2°F | Resp 18

## 2024-02-26 DIAGNOSIS — D649 Anemia, unspecified: Secondary | ICD-10-CM

## 2024-02-26 DIAGNOSIS — D500 Iron deficiency anemia secondary to blood loss (chronic): Secondary | ICD-10-CM | POA: Diagnosis not present

## 2024-02-26 MED ORDER — IRON SUCROSE 20 MG/ML IV SOLN
200.0000 mg | Freq: Once | INTRAVENOUS | Status: AC
  Administered 2024-02-26: 200 mg via INTRAVENOUS
  Filled 2024-02-26: qty 10

## 2024-02-26 NOTE — Patient Instructions (Signed)

## 2024-03-04 ENCOUNTER — Inpatient Hospital Stay: Attending: Internal Medicine

## 2024-03-04 VITALS — BP 107/67 | HR 73 | Temp 96.8°F | Resp 18

## 2024-03-04 DIAGNOSIS — D500 Iron deficiency anemia secondary to blood loss (chronic): Secondary | ICD-10-CM | POA: Insufficient documentation

## 2024-03-04 DIAGNOSIS — N920 Excessive and frequent menstruation with regular cycle: Secondary | ICD-10-CM | POA: Insufficient documentation

## 2024-03-04 DIAGNOSIS — D649 Anemia, unspecified: Secondary | ICD-10-CM

## 2024-03-04 MED ORDER — SODIUM CHLORIDE 0.9% FLUSH
10.0000 mL | Freq: Once | INTRAVENOUS | Status: AC | PRN
  Administered 2024-03-04: 10 mL
  Filled 2024-03-04: qty 10

## 2024-03-04 MED ORDER — IRON SUCROSE 20 MG/ML IV SOLN
200.0000 mg | Freq: Once | INTRAVENOUS | Status: AC
  Administered 2024-03-04: 200 mg via INTRAVENOUS
  Filled 2024-03-04: qty 10

## 2024-03-17 ENCOUNTER — Ambulatory Visit

## 2024-03-17 DIAGNOSIS — D509 Iron deficiency anemia, unspecified: Secondary | ICD-10-CM | POA: Diagnosis not present

## 2024-03-17 DIAGNOSIS — D500 Iron deficiency anemia secondary to blood loss (chronic): Secondary | ICD-10-CM

## 2024-03-17 DIAGNOSIS — N920 Excessive and frequent menstruation with regular cycle: Secondary | ICD-10-CM | POA: Diagnosis not present

## 2024-03-19 ENCOUNTER — Encounter: Payer: Self-pay | Admitting: Obstetrics and Gynecology

## 2024-03-24 ENCOUNTER — Ambulatory Visit: Admitting: Family Medicine

## 2024-03-24 NOTE — Telephone Encounter (Signed)
 Pls call pt to schedule ablation consult/EMB with MD. Thx.

## 2024-03-24 NOTE — Telephone Encounter (Signed)
 Pt called back and is aware of results. She would like to make an appointment to discuss ablation.

## 2024-03-24 NOTE — Telephone Encounter (Signed)
 Per ABC call patient to let her know US  results, patient has not read ABC's message. No answer, LVMTRC.

## 2024-03-30 ENCOUNTER — Ambulatory Visit

## 2024-03-30 ENCOUNTER — Encounter: Payer: Self-pay | Admitting: Internal Medicine

## 2024-03-31 LAB — VITAMIN B12: Vitamin B-12: 321 pg/mL (ref 232–1245)

## 2024-04-13 ENCOUNTER — Ambulatory Visit: Payer: Self-pay | Admitting: Family Medicine

## 2024-04-13 DIAGNOSIS — E538 Deficiency of other specified B group vitamins: Secondary | ICD-10-CM

## 2024-04-16 DIAGNOSIS — E538 Deficiency of other specified B group vitamins: Secondary | ICD-10-CM | POA: Insufficient documentation

## 2024-04-16 MED ORDER — SYRINGE 25G X 1" 3 ML MISC: 1.0000 | 0 refills | Status: AC

## 2024-04-16 MED ORDER — CYANOCOBALAMIN 1000 MCG/ML IJ SOLN: 1000.0000 ug | INTRAMUSCULAR | 0 refills | Status: AC

## 2024-04-20 NOTE — Telephone Encounter (Signed)
 open in error

## 2024-05-05 ENCOUNTER — Encounter: Payer: Self-pay | Admitting: *Deleted

## 2024-05-06 ENCOUNTER — Inpatient Hospital Stay: Admitting: Internal Medicine

## 2024-05-06 ENCOUNTER — Encounter: Payer: Self-pay | Admitting: Internal Medicine

## 2024-05-06 ENCOUNTER — Inpatient Hospital Stay

## 2024-05-06 ENCOUNTER — Inpatient Hospital Stay: Attending: Internal Medicine

## 2024-05-06 VITALS — BP 119/86

## 2024-05-06 VITALS — BP 135/84 | HR 71 | Temp 97.2°F | Resp 16 | Ht 64.5 in | Wt 193.3 lb

## 2024-05-06 DIAGNOSIS — K921 Melena: Secondary | ICD-10-CM

## 2024-05-06 DIAGNOSIS — D500 Iron deficiency anemia secondary to blood loss (chronic): Secondary | ICD-10-CM | POA: Diagnosis present

## 2024-05-06 DIAGNOSIS — D649 Anemia, unspecified: Secondary | ICD-10-CM

## 2024-05-06 DIAGNOSIS — N920 Excessive and frequent menstruation with regular cycle: Secondary | ICD-10-CM | POA: Diagnosis present

## 2024-05-06 LAB — IRON AND TIBC
Iron: 36 ug/dL (ref 28–170)
Saturation Ratios: 8 % — ABNORMAL LOW (ref 10.4–31.8)
TIBC: 430 ug/dL (ref 250–450)
UIBC: 394 ug/dL

## 2024-05-06 LAB — CBC WITH DIFFERENTIAL (CANCER CENTER ONLY)
Abs Immature Granulocytes: 0.03 K/uL (ref 0.00–0.07)
Basophils Absolute: 0 K/uL (ref 0.0–0.1)
Basophils Relative: 1 %
Eosinophils Absolute: 0.2 K/uL (ref 0.0–0.5)
Eosinophils Relative: 3 %
HCT: 38.7 % (ref 36.0–46.0)
Hemoglobin: 12.5 g/dL (ref 12.0–15.0)
Immature Granulocytes: 1 %
Lymphocytes Relative: 33 %
Lymphs Abs: 2.1 K/uL (ref 0.7–4.0)
MCH: 27.9 pg (ref 26.0–34.0)
MCHC: 32.3 g/dL (ref 30.0–36.0)
MCV: 86.4 fL (ref 80.0–100.0)
Monocytes Absolute: 0.5 K/uL (ref 0.1–1.0)
Monocytes Relative: 8 %
Neutro Abs: 3.7 K/uL (ref 1.7–7.7)
Neutrophils Relative %: 54 %
Platelet Count: 264 K/uL (ref 150–400)
RBC: 4.48 MIL/uL (ref 3.87–5.11)
RDW: 15.1 % (ref 11.5–15.5)
WBC Count: 6.6 K/uL (ref 4.0–10.5)
nRBC: 0 % (ref 0.0–0.2)

## 2024-05-06 LAB — BASIC METABOLIC PANEL - CANCER CENTER ONLY
Anion gap: 10 (ref 5–15)
BUN: 9 mg/dL (ref 6–20)
CO2: 26 mmol/L (ref 22–32)
Calcium: 9.7 mg/dL (ref 8.9–10.3)
Chloride: 103 mmol/L (ref 98–111)
Creatinine: 0.79 mg/dL (ref 0.44–1.00)
GFR, Estimated: >60 mL/min (ref >60)
Glucose, Bld: 111 mg/dL — ABNORMAL HIGH (ref 70–99)
Potassium: 4.4 mmol/L (ref 3.5–5.1)
Sodium: 140 mmol/L (ref 135–145)

## 2024-05-06 LAB — FERRITIN: Ferritin: 58 ng/mL (ref 11–307)

## 2024-05-06 MED ORDER — IRON SUCROSE 20 MG/ML IV SOLN
200.0000 mg | Freq: Once | INTRAVENOUS | Status: AC
  Administered 2024-05-06: 200 mg via INTRAVENOUS
  Filled 2024-05-06: qty 10

## 2024-05-06 NOTE — Progress Notes (Signed)
 [facility] CONSULT NOTE  Patient Care Team: Pardue, Lauraine SAILOR, DO as PCP - General (Family Medicine) Rennie Cindy SAUNDERS, MD as Consulting Physician (Oncology)  CHIEF COMPLAINTS/PURPOSE OF CONSULTATION: ANEMIA   HEMATOLOGY HISTORY    Latest Reference Range & Units 01/15/24 15:52  Iron  27 - 159 ug/dL 17 (L)  UIBC 868 - 574 ug/dL 542 (H)  TIBC 749 - 549 ug/dL 525 (H)  Ferritin 15 - 150 ng/mL 9 (L)  Iron  Saturation 15 - 55 % 4 (LL)  (LL): Data is critically low (L): Data is abnormally low (H): Data is abnormally high # ANEMIA[Hb; MCV-platelets- WBC; Iron  sat; ferritin;  GFR- CT/US - ;   HISTORY OF PRESENTING ILLNESS: Patient ambulating-independently .  Alone   Desiree Green 38 y.o.  female pleasant patient with ? Hx of colitis [undiagnosed] and history of heavy menstrual cycles with iron  def anemia is here for a follow up.  Discussed the use of AI scribe software for clinical note transcription with the patient, who gave verbal consent to proceed.  History of Present Illness   Desiree Green is a 38 year old female with iron  deficiency anemia who presents for follow-up after iron  infusions.  She has a history of iron  deficiency anemia and has received four iron  infusions, resulting in an improvement in hemoglobin levels from 10 to 12.5. She feels better overall but continues to experience fatigue. Her menstrual period recently lasted two weeks and was notably heavy.  She has a history of heavy menstrual cycles. She recalls that in November, an ultrasound was performed to assess her candidacy for procedures like hysterectomy or ablation. She has two children and has undergone tubal ligation.  She reports ongoing hematochezia, with blood in her stool occurring at least two to three times a week. The blood is now more frequently bright red, whereas it was previously darker. She has not yet seen a gastroenterologist despite a referral to [facility] and has not followed up  with  GI due to a previous COVID infection and changes in the medical staff there.  She is currently receiving B12 injections due to difficulty remembering to take oral supplements.       Review of Systems  Constitutional:  Positive for malaise/fatigue. Negative for chills, diaphoresis, fever and weight loss.  HENT:  Negative for nosebleeds and sore throat.   Eyes:  Negative for double vision.  Respiratory:  Negative for cough, hemoptysis, sputum production, shortness of breath and wheezing.   Cardiovascular:  Negative for chest pain, palpitations, orthopnea and leg swelling.  Gastrointestinal:  Negative for abdominal pain, blood in stool, constipation, diarrhea, heartburn, melena, nausea and vomiting.  Genitourinary:  Negative for dysuria, frequency and urgency.  Musculoskeletal:  Negative for back pain and joint pain.  Skin: Negative.  Negative for itching and rash.  Neurological:  Negative for tingling, focal weakness, weakness and headaches.  Endo/Heme/Allergies:  Does not bruise/bleed easily.  Psychiatric/Behavioral:  Negative for depression. The patient is not nervous/anxious and does not have insomnia.      MEDICAL HISTORY:  Past Medical History:  Diagnosis Date   Allergy     Anemia    Anxiety    Arthritis    Back/hip   Cancer (HCC)    Cervical dysplasia 2007   LEEP   Colitis    Depression    Diabetes mellitus without complication (HCC)    Migraine    1x/mo    SURGICAL HISTORY: Past Surgical History:  Procedure Laterality Date  DILATION AND CURETTAGE OF UTERUS     HERNIA REPAIR     LEEP  2007   TUBAL LIGATION      SOCIAL HISTORY: Social History   Socioeconomic History   Marital status: Married    Spouse name: Not on file   Number of children: Not on file   Years of education: Not on file   Highest education level: 12th grade  Occupational History   Not on file  Tobacco Use   Smoking status: Some Days    Current packs/day: 0.25    Average  packs/day: 0.9 packs/day for 17.7 years (16.4 ttl pk-yrs)    Types: Cigarettes    Start date: 05/07/2002    Last attempt to quit: 05/07/2018   Smokeless tobacco: Never   Tobacco comments:    started age 3.    Vaping Use   Vaping status: Former  Substance and Sexual Activity   Alcohol use: Yes    Comment: rarely   Drug use: Never   Sexual activity: Yes    Birth control/protection: Surgical    Comment: Tubal Ligation  Other Topics Concern   Not on file  Social History Narrative   Not on file   Social Drivers of Health   Financial Resource Strain: Medium Risk (01/13/2024)   Overall Financial Resource Strain (CARDIA)    Difficulty of Paying Living Expenses: Somewhat hard  Food Insecurity: No Food Insecurity (01/29/2024)   Hunger Vital Sign    Worried About Running Out of Food in the Last Year: Never true    Ran Out of Food in the Last Year: Never true  Recent Concern: Food Insecurity - Food Insecurity Present (01/13/2024)   Hunger Vital Sign    Worried About Running Out of Food in the Last Year: Sometimes true    Ran Out of Food in the Last Year: Sometimes true  Transportation Needs: No Transportation Needs (01/29/2024)   PRAPARE - Administrator, Civil Service (Medical): No    Lack of Transportation (Non-Medical): No  Physical Activity: Sufficiently Active (01/13/2024)   Exercise Vital Sign    Days of Exercise per Week: 5 days    Minutes of Exercise per Session: 30 min  Stress: Stress Concern Present (01/13/2024)   [employer] of Occupational Health - Occupational Stress Questionnaire    Feeling of Stress: Very much  Social Connections: Socially Isolated (01/13/2024)   Social Connection and Isolation Panel    Frequency of Communication with Friends and Family: More than three times a week    Frequency of Social Gatherings with Friends and Family: More than three times a week    Attends Religious Services: Patient declined    Database Administrator or  Organizations: No    Attends Engineer, Structural: Not on file    Marital Status: Separated  Intimate Partner Violence: Not At Risk (01/29/2024)   Humiliation, Afraid, Rape, and Kick questionnaire    Fear of Current or Ex-Partner: No    Emotionally Abused: No    Physically Abused: No    Sexually Abused: No    FAMILY HISTORY: Family History  Problem Relation Age of Onset   Thyroid  disease Mother    Alcohol abuse Mother    Anxiety disorder Mother    Depression Mother    Hypertension Father    Alcohol abuse Father    Anxiety disorder Father    Skin cancer Father    Depression Father    Prostate cancer Father 37  Ovarian cancer Sister    Breast cancer Maternal Aunt        53s   Melanoma Maternal Grandmother    Diabetes Maternal Grandmother    Colon cancer Paternal Grandfather    Lung cancer Paternal Grandfather    Asthma Daughter    ADD / ADHD Son     ALLERGIES:  is allergic to benadryl [diphenhydramine], ibuprofen , and lactose intolerance (gi).  MEDICATIONS:  Current Outpatient Medications  Medication Sig Dispense Refill   cyanocobalamin  (VITAMIN B12) 1000 MCG/ML injection Inject 1 mL (1,000 mcg total) into the muscle every 30 (thirty) days. 3 mL 0   naproxen  (NAPROSYN ) 500 MG tablet Take 1 tablet (500 mg total) by mouth 2 (two) times daily with a meal. 60 tablet 0   ondansetron  (ZOFRAN ) 8 MG tablet Take 1 tablet (8 mg total) by mouth every 8 (eight) hours as needed for nausea or vomiting. 20 tablet 0   Syringe/Needle, Disp, (SYRINGE 3CC/25GX1) 25G X 1 3 ML MISC 1 each by Does not apply route every 30 (thirty) days. 3 each 0   No current facility-administered medications for this visit.   PHYSICAL EXAMINATION:   Vitals:   05/06/24 1445  BP: 135/84  Pulse: 71  Resp: 16  Temp: (!) 97.2 F (36.2 C)  SpO2: 100%   Filed Weights   05/06/24 1445  Weight: 193 lb 4.8 oz (87.7 kg)    Physical Exam Vitals and nursing note reviewed.  HENT:     Head:  Normocephalic and atraumatic.     Mouth/Throat:     Pharynx: Oropharynx is clear.  Eyes:     Extraocular Movements: Extraocular movements intact.     Pupils: Pupils are equal, round, and reactive to light.  Cardiovascular:     Rate and Rhythm: Normal rate and regular rhythm.  Pulmonary:     Comments: Decreased breath sounds bilaterally.  Abdominal:     Palpations: Abdomen is soft.  Musculoskeletal:        General: Normal range of motion.     Cervical back: Normal range of motion.  Skin:    General: Skin is warm.  Neurological:     General: No focal deficit present.     Mental Status: She is alert and oriented to person, place, and time.  Psychiatric:        Behavior: Behavior normal.        Judgment: Judgment normal.      LABORATORY DATA:  I have reviewed the data as listed Lab Results  Component Value Date   WBC 6.6 05/06/2024   HGB 12.5 05/06/2024   HCT 38.7 05/06/2024   MCV 86.4 05/06/2024   PLT 264 05/06/2024   Recent Labs    01/15/24 1552 05/06/24 1434  NA 138 140  K 4.1 4.4  CL 104 103  CO2 21 26  GLUCOSE 81 111*  BUN 13 9  CREATININE 0.85 0.79  CALCIUM 9.4 9.7  GFRNONAA  --  >60  PROT 7.0  --   ALBUMIN 4.6  --   AST 16  --   ALT 13  --   ALKPHOS 57  --   BILITOT 0.3  --      No results found.  ASSESSMENT & PLAN:   Symptomatic anemia # Anemia- Hb-symptomatic.  Likely due to iron  deficiency - from etiology GI blood loss/menorrhagia. AUG 2025-Hb 10.5- ferratin- 5.  Poor tolerance/lack of improvement on oral iron .    S/p  IV iron  infusion/Venofer - improved.   #  Etiology of iron  deficiency: blood loss- sec to menorrhagia [Dr.Copeland]- ? TAH vs- ablation- not done; awaiting  repeat US - EGD/colonoscopy-pending- [facility] referral possible colitis.  Given patient's preference will refer to [facility] GI.   # B12 def- on b12 injection [PCP]  # cervical cancer- [at 38 years of age]- [facility]- s/p LEEP- ? RT- clinically NED   Tubal ligation # DISPOSITION: # refer  to [facility] -GI re: blood in stool # Venofer  today # venofer  in 1 month # follow up 4 month- MD; labs- cbc/bmp; iorn studies; ferritin-; possible venofer - Dr.B  All questions were answered. The patient knows to call the clinic with any problems, questions or concerns.    Cindy JONELLE Joe, MD 05/09/2024 4:10 PM

## 2024-05-06 NOTE — Patient Instructions (Signed)

## 2024-05-06 NOTE — Progress Notes (Unsigned)
 Fatigue/weakness: NO Dyspena: NO Light headedness: AT TIMES Blood in stool: OCC. BLOOD BRIGHT RED

## 2024-05-06 NOTE — Assessment & Plan Note (Addendum)
#   Anemia- Hb-symptomatic.  Likely due to iron  deficiency - from etiology GI blood loss/menorrhagia. AUG 2025-Hb 10.5- ferratin- 5.  Poor tolerance/lack of improvement on oral iron .    S/p  IV iron  infusion/Venofer - improved.   #Etiology of iron  deficiency: blood loss- sec to menorrhagia [Dr.Copeland]- ? TAH vs- ablation- not done; awaiting  repeat US - EGD/colonoscopy-pending- [facility] referral possible colitis.  Given patient's preference will refer to  [facility] GI.   # B12 def- on b12 injection [PCP]  # cervical cancer- [at 38 years of age]- [facility]- s/p LEEP- ? RT- clinically NED   Tubal ligation # DISPOSITION: # refer to [facility] -GI re: blood in stool # Venofer  today # venofer  in 1 month # follow up 4 month- MD; labs- cbc/bmp; iorn studies; ferritin-; possible venofer - Dr.B

## 2024-05-09 ENCOUNTER — Encounter: Payer: Self-pay | Admitting: Internal Medicine

## 2024-05-12 ENCOUNTER — Encounter: Payer: Self-pay | Admitting: Intensive Care

## 2024-05-12 ENCOUNTER — Emergency Department

## 2024-05-12 ENCOUNTER — Ambulatory Visit: Payer: Self-pay

## 2024-05-12 ENCOUNTER — Other Ambulatory Visit: Payer: Self-pay

## 2024-05-12 ENCOUNTER — Emergency Department: Admission: EM | Admit: 2024-05-12 | Discharge: 2024-05-12 | Disposition: A | Discharge status: Home / Self Care

## 2024-05-12 DIAGNOSIS — E119 Type 2 diabetes mellitus without complications: Secondary | ICD-10-CM | POA: Diagnosis not present

## 2024-05-12 DIAGNOSIS — K625 Hemorrhage of anus and rectum: Secondary | ICD-10-CM | POA: Insufficient documentation

## 2024-05-12 DIAGNOSIS — R112 Nausea with vomiting, unspecified: Secondary | ICD-10-CM

## 2024-05-12 DIAGNOSIS — R1085 Abdominal pain of multiple sites: Secondary | ICD-10-CM

## 2024-05-12 DIAGNOSIS — K529 Noninfective gastroenteritis and colitis, unspecified: Secondary | ICD-10-CM

## 2024-05-12 LAB — CBC WITH DIFFERENTIAL/PLATELET
Abs Immature Granulocytes: 0.02 K/uL (ref 0.00–0.07)
Basophils Absolute: 0 K/uL (ref 0.0–0.1)
Basophils Relative: 1 %
Eosinophils Absolute: 0.1 K/uL (ref 0.0–0.5)
Eosinophils Relative: 2 %
HCT: 39.7 % (ref 36.0–46.0)
Hemoglobin: 12.7 g/dL (ref 12.0–15.0)
Immature Granulocytes: 0 %
Lymphocytes Relative: 28 %
Lymphs Abs: 1.7 K/uL (ref 0.7–4.0)
MCH: 27.6 pg (ref 26.0–34.0)
MCHC: 32 g/dL (ref 30.0–36.0)
MCV: 86.3 fL (ref 80.0–100.0)
Monocytes Absolute: 0.5 K/uL (ref 0.1–1.0)
Monocytes Relative: 8 %
Neutro Abs: 3.8 K/uL (ref 1.7–7.7)
Neutrophils Relative %: 61 %
Platelets: 251 K/uL (ref 150–400)
RBC: 4.6 MIL/uL (ref 3.87–5.11)
RDW: 14.9 % (ref 11.5–15.5)
WBC: 6.1 K/uL (ref 4.0–10.5)
nRBC: 0 % (ref 0.0–0.2)

## 2024-05-12 LAB — COMPREHENSIVE METABOLIC PANEL WITH GFR
ALT: 13 U/L (ref 0–44)
AST: 17 U/L (ref 15–41)
Albumin: 4.4 g/dL (ref 3.5–5.0)
Alkaline Phosphatase: 50 U/L (ref 38–126)
Anion gap: 10 (ref 5–15)
BUN: 8 mg/dL (ref 6–20)
CO2: 24 mmol/L (ref 22–32)
Calcium: 9.6 mg/dL (ref 8.9–10.3)
Chloride: 106 mmol/L (ref 98–111)
Creatinine, Ser: 0.8 mg/dL (ref 0.44–1.00)
GFR, Estimated: >60 mL/min (ref >60)
Glucose, Bld: 100 mg/dL — ABNORMAL HIGH (ref 70–99)
Potassium: 4.3 mmol/L (ref 3.5–5.1)
Sodium: 140 mmol/L (ref 135–145)
Total Bilirubin: 0.3 mg/dL (ref 0.0–1.2)
Total Protein: 7.1 g/dL (ref 6.5–8.1)

## 2024-05-12 LAB — POC URINE PREG, ED: Preg Test, Ur: NEGATIVE

## 2024-05-12 LAB — TYPE AND SCREEN
ABO/RH(D): O POS
Antibody Screen: NEGATIVE

## 2024-05-12 MED ORDER — BENEFIBER DRINK MIX PO PACK: 1.0000 | PACK | Freq: Every day | ORAL | 0 refills | Status: AC

## 2024-05-12 MED ORDER — SODIUM CHLORIDE 0.9 % IV BOLUS
1000.0000 mL | Freq: Once | INTRAVENOUS | Status: AC
  Administered 2024-05-12: 1000 mL via INTRAVENOUS

## 2024-05-12 MED ORDER — IOHEXOL 300 MG/ML  SOLN
100.0000 mL | Freq: Once | INTRAMUSCULAR | Status: AC | PRN
  Administered 2024-05-12: 100 mL via INTRAVENOUS

## 2024-05-12 MED ORDER — DICYCLOMINE HCL 10 MG PO CAPS
10.0000 mg | ORAL_CAPSULE | Freq: Once | ORAL | Status: AC
  Administered 2024-05-12: 10 mg via ORAL
  Filled 2024-05-12: qty 1

## 2024-05-12 MED ORDER — DICYCLOMINE HCL 10 MG PO CAPS: 10.0000 mg | ORAL_CAPSULE | Freq: Three times a day (TID) | ORAL | 0 refills | Status: AC | PRN

## 2024-05-12 MED ORDER — ONDANSETRON 4 MG PO TBDP: 4.0000 mg | ORAL_TABLET | Freq: Three times a day (TID) | ORAL | 0 refills | Status: AC | PRN

## 2024-05-12 MED ORDER — ONDANSETRON HCL 4 MG/2ML IJ SOLN
4.0000 mg | Freq: Once | INTRAMUSCULAR | Status: AC
  Administered 2024-05-12: 4 mg via INTRAVENOUS
  Filled 2024-05-12: qty 2

## 2024-05-12 NOTE — ED Provider Notes (Signed)
°  Physical Exam  BP (!) 140/99 (BP Location: Left Arm)   Pulse 84   Temp 98.8 F (37.1 C) (Oral)   Resp 15   Ht 5' 5 (1.651 m)   Wt 87.5 kg   LMP 04/22/2024 (Exact Date)   SpO2 100%   BMI 32.12 kg/m   Physical Exam  Procedures  Procedures  ED Course / MDM   Clinical Course as of 05/12/24 1727  Thu May 12, 2024  1322 Independent review of labs, electrolytes not severely deranged, pregnancy test is negative, no leukocytosis, H&H is stable. [TT]  1502 S/o from Dr. Waymond - ab pain, n/v, diarrhea that resolved, now some constipation. Mild blood on straining from rectum, rectal exam w/ nonbleeding hemorrhoid.   Has GI appt later this month  CT pending. If negative, anticipate DC home w/ antiemetic.  TO DO: - f/u CT, re-eval [MM]  1549 CTAP: IMPRESSION: 1. Mild wall thickening and pericolonic fat stranding involving the descending colon, consistent with segmental inflammatory or infectious colitis. 2. Otherwise unremarkable exam.   [MM]  1725 Patient reevaluated, well-appearing here.  Reassured by unremarkable workup.  Symptoms adequately controlled.  Repeat abdominal exam benign, no significant abdominal tenderness to deep palpation.  Given that diarrhea has resolved, no clear indication for empiric course of antibiotics at this time.  Patient is already using naproxen  as needed, also recommend Tylenol  in addition.  Dr. Tan has already prescribed Zofran , Bentyl , Benefiber for patient's recent constipation.  Plan for PMD follow-up and also plan to keep her GI appointment later this month.  ED return precautions in place.  Patient agrees with plan. [MM]    Clinical Course User Index [MM] Clarine Ozell LABOR, MD [TT] Waymond Lorelle Cummins, MD   Medical Decision Making Amount and/or Complexity of Data Reviewed Labs: ordered. Radiology: ordered.  Risk OTC drugs. Prescription drug management.          Clarine Ozell LABOR, MD 05/12/24 [phone]

## 2024-05-12 NOTE — ED Provider Notes (Signed)
 [name] Provider Note    Event Date/Time   First MD Initiated Contact with Patient 05/12/24 1321     (approximate)   History   Abdominal Pain and Rectal Bleeding   HPI  Desiree Green is a 38 y.o. female history of anemia receiving iron  transfusions, migraine, diabetes, presenting with abdominal pain and bright red blood per rectum.  States that earlier this week she started having nausea vomiting diarrhea, noticed some blood in her stool.  Also noted crampy abdominal pain.  Subjective fever.  States that the diarrhea has stopped.  Now she is constipated, has not had bowel movement 2 days, when she strains on the toilet, she will notice blood in the toilet bowl.  No known history of hemorrhoids.  On independent chart review, she was seen by heme-onc on 5 December, this history of iron  deficiency anemia, was following up for iron  transfusions.  She had noted to them about ongoing hematochezia with blood in her stool occurring at least 2-3 times a week.  Has not seen GI despite referral to [facility].  Has an appointment with [facility] on 22 December.     Physical Exam   Triage Vital Signs: ED Triage Vitals  Encounter Vitals Group     BP 05/12/24 1153 (!) 140/99     Girls Systolic BP Percentile --      Girls Diastolic BP Percentile --      Boys Systolic BP Percentile --      Boys Diastolic BP Percentile --      Pulse Rate 05/12/24 1153 84     Resp 05/12/24 1153 15     Temp 05/12/24 1153 98.8 F (37.1 C)     Temp Source 05/12/24 1153 Oral     SpO2 05/12/24 1153 100 %     Weight 05/12/24 1149 193 lb (87.5 kg)     Height 05/12/24 1149 5' 5 (1.651 m)     Head Circumference --      Peak Flow --      Pain Score 05/12/24 1149 6     Pain Loc --      Pain Education --      Exclude from Growth Chart --     Most recent vital signs: Vitals:   05/12/24 1153  BP: (!) 140/99  Pulse: 84  Resp: 15  Temp: 98.8 F (37.1 C)  SpO2: 100%      General: Awake, no distress. CV:  Good peripheral perfusion.  Resp:  Normal effort.  Abd:  No distention.  Soft, mildly tender to the periumbilical and suprapubic region Other:  Rectal exam was done with chaperone, small external hemorrhoid, nonbleeding, no stool in rectal vault, no bloody mucosa.   ED Results / Procedures / Treatments   Labs (all labs ordered are listed, but only abnormal results are displayed) Labs Reviewed  COMPREHENSIVE METABOLIC PANEL WITH GFR - Abnormal; Notable for the following components:      Result Value   Glucose, Bld 100 (*)    All other components within normal limits  CBC WITH DIFFERENTIAL/PLATELET  POC URINE PREG, ED  TYPE AND SCREEN      PROCEDURES:  Critical Care performed: No  Procedures   MEDICATIONS ORDERED IN ED: Medications  dicyclomine  (BENTYL ) capsule 10 mg (has no administration in time range)  sodium chloride  0.9 % bolus 1,000 mL (1,000 mLs Intravenous New Bag/Given 05/12/24 1406)  ondansetron  (ZOFRAN ) injection 4 mg (4 mg Intravenous Given 05/12/24  1406)  iohexol  (OMNIPAQUE ) 300 MG/ML solution 100 mL (100 mLs Intravenous Contrast Given 05/12/24 1500)     IMPRESSION / MDM / ASSESSMENT AND PLAN / ED COURSE  I reviewed the triage vital signs and the nursing notes.                              Differential diagnosis includes, but is not limited to, hemorrhoids, colitis, diverticulitis, viral illness, electrolyte derangements, dehydration.  Will get labs, CT, IV fluids, IV Zofran .  Patient's presentation is most consistent with acute presentation with potential threat to life or bodily function.  Independent interpretation of labs below.  Patient signed out pending CT imaging result, will send Bentyl  and Zofran  to her pharmacy.  Also instructed her to follow-up with her GI appointment at the end of December.    Clinical Course as of 05/12/24 1505  Thu May 12, 2024  1322 Independent review of labs, electrolytes not  severely deranged, pregnancy test is negative, no leukocytosis, H&H is stable. [TT]  1502 S/o from Dr. Waymond - ab pain, n/v, diarrhea that resolved, now some constipation. Mild blood on straining from rectum, rectal exam w/ nonbleeding hemorrhoid.   Has GI appt later this month  CT pending. If negative, anticipate DC home w/ antiemetic.  TO DO: - f/u CT, re-eval [MM]    Clinical Course User Index [MM] Clarine Ozell LABOR, MD [TT] Waymond Lorelle Cummins, MD     FINAL CLINICAL IMPRESSION(S) / ED DIAGNOSES   Final diagnoses:  Abdominal pain of multiple sites  Rectal bleeding  Nausea and vomiting, unspecified vomiting type     Rx / DC Orders   ED Discharge Orders          Ordered    ondansetron  (ZOFRAN -ODT) 4 MG disintegrating tablet  Every 8 hours PRN        05/12/24 1504    dicyclomine  (BENTYL ) 10 MG capsule  Every 8 hours PRN        05/12/24 1504    Wheat Dextrin (BENEFIBER DRINK MIX) PACK  Daily        05/12/24 1504             Note:  This document was prepared using Dragon voice recognition software and may include unintentional dictation errors.    Waymond Lorelle Cummins, MD 05/12/24 [phone]

## 2024-05-12 NOTE — Telephone Encounter (Signed)
 Agreed that patient should go to the ED

## 2024-05-12 NOTE — ED Triage Notes (Signed)
 Patient presents with dizziness, rectal bleeding/pressure, and abdominal pain since Saturday  Patient reports she has been receiving iron  infusions since around September 2025.

## 2024-05-12 NOTE — Telephone Encounter (Signed)
 LVM informing patient of provider's recommendation of needing to been evaluated at the ED.

## 2024-05-12 NOTE — Discharge Instructions (Signed)
 Please make sure to make it to your appointment with GI this month.  Please make sure to follow-up with your primary care doctor next week for reassessment.  Since your diarrhea has resolved and now you are constipated, I have sent some Benefiber to your pharmacy.  Also I am sending Zofran .

## 2024-05-12 NOTE — Telephone Encounter (Signed)
 FYI Only or Action Required?: FYI only for provider: ED advised.  Patient was last seen in primary care on 01/15/2024 by Donzella Lauraine SAILOR, DO.  Called Nurse Triage reporting Rectal Bleeding.  Symptoms began several days ago.  Interventions attempted: OTC medications: OTC nausea relief and Prescription medications: Zofran .  Symptoms are: unchanged.  Triage Disposition: Go to ED Now (Notify PCP)  Patient/caregiver understands and will follow disposition?: Yes Reason for Disposition  [1] Constant abdominal pain AND [2] present > 2 hours  Answer Assessment - Initial Assessment Questions Patient reports iron  deficiency and getting infusions dx with colitis, thinks its a flare up. Patient has GI appointment 12/22. Patient reports taking a Zofran  yesterday for nausea. Advised ED for symptoms, patient agreeable   1. APPEARANCE of BLOOD: What color is it? Is it passed separately, on the surface of the stool, or mixed in with the stool?      Mucous, red blood  2. ONSET: When was the blood first seen in the stools? (Days or weeks)      Saturday - Tuesday evening  3. DIARRHEA: Is there also some diarrhea? If Yes, ask: How many diarrhea stools in the past 24 hours?      Yes, started 5 days ago and then stopped  4. CONSTIPATION: Do you have constipation? If Yes, ask: How bad is it?     Last BM 2 days ago , feeling pressure like has to go but cannot go  5.  BLOOD THINNERS: Do you take any blood thinners? (e.g., aspirin, clopidogrel / Plavix, coumadin, heparin). Notes: Other strong blood thinners include: Arixtra (fondaparinux), Eliquis (apixaban), Pradaxa (dabigatran), and Xarelto (rivaroxaban).     Denies  6. OTHER SYMPTOMS: Do you have any other symptoms?  (e.g., abdomen pain, vomiting, dizziness, fever)     Abd pain, around the naval and below, occasional vomiting in last day or two, fatigue  Protocols used: Rectal Bleeding-A-AH  Copied from CRM #[id number]. Topic: Clinical  - Red Word Triage >> May 12, 2024 10:58 AM Leonette P wrote: Diarrhea , vomiting,  blood in stools

## 2024-05-22 ENCOUNTER — Telehealth: Admitting: Physician Assistant

## 2024-05-22 DIAGNOSIS — J111 Influenza due to unidentified influenza virus with other respiratory manifestations: Secondary | ICD-10-CM | POA: Diagnosis not present

## 2024-05-22 MED ORDER — OSELTAMIVIR PHOSPHATE 75 MG PO CAPS: 75.0000 mg | ORAL_CAPSULE | Freq: Two times a day (BID) | ORAL | 0 refills | Status: AC

## 2024-05-22 MED ORDER — BENZONATATE 100 MG PO CAPS: 100.0000 mg | ORAL_CAPSULE | Freq: Three times a day (TID) | ORAL | 0 refills | Status: AC | PRN

## 2024-05-22 NOTE — Progress Notes (Signed)
 E visit for Flu like symptoms   We are sorry that you are not feeling well.  Here is how we plan to help! Based on what you have shared with me it looks like you may have a respiratory virus that may be influenza.  Influenza or "the flu" is  an infection caused by a respiratory virus. The flu virus is highly contagious and persons who did not receive their yearly flu vaccination may "catch" the flu from close contact.  We have anti-viral medications to treat the viruses that cause this infection. They are not a "cure" and only shorten the course of the infection. These prescriptions are most effective when they are given within the first 2 days of "flu" symptoms. Antiviral medications are indicated if you have a high risk of complications from the flu. You should  also consider an antiviral medication if you are in close contact with someone who is at risk. These medications can help patients avoid complications from the flu but have side effects that you should know.   Possible side effects from Tamiflu or oseltamivir include nausea, vomiting, diarrhea, dizziness, headaches, eye redness, sleep problems or other respiratory symptoms. You should not take Tamiflu if you have an allergy to oseltamivir or any to the ingredients in Tamiflu.  Based upon your symptoms and potential risk factors I have prescribed Oseltamivir (Tamiflu).  It has been sent to your designated pharmacy.  You will take one 75 mg capsule orally twice a day for the next 5 days.   For nasal congestion, you may use an oral decongestant such as Mucinex  D or if you have glaucoma or high blood pressure use plain Mucinex .  Saline nasal spray or nasal drops can help and can safely be used as often as needed for congestion.  If you have a sore or scratchy throat, use a saltwater gargle-  to  teaspoon of salt dissolved in a 4-ounce to 8-ounce glass of warm water.  Gargle the solution for approximately 15-30 seconds and then spit.  It is  important not to swallow the solution.  You can also use throat lozenges/cough drops and Chloraseptic spray to help with throat pain or discomfort.  Warm or cold liquids can also be helpful in relieving throat pain.  For headache, pain or general discomfort, you can use Ibuprofen  or Tylenol  as directed.   Some authorities believe that zinc sprays or the use of Echinacea may shorten the course of your symptoms.  I have prescribed the following medications to help lessen symptoms: I have prescribed Tessalon  Perles 100 mg. You may take 1-2 capsules every 8 hours as needed for cough  You are to isolate at home until you have been fever-free for at least 24 hours without a fever-reducing medication, and symptoms have been steadily improving for 24 hours.  If you must be around other household members who do not have symptoms, you need to make sure that both you and the family members are masking consistently with a high-quality mask.  If you note any worsening of symptoms despite treatment, please seek an in-person evaluation ASAP. If you note any significant shortness of breath or any chest pain, please seek ED evaluation. Please do not delay care!  ANYONE WHO HAS FLU SYMPTOMS SHOULD: Stay home. The flu is highly contagious and going out or to work exposes others! Be sure to drink plenty of fluids. Water is fine as well as fruit juices, sodas and electrolyte beverages. You may want to stay  away from caffeine or alcohol. If you are nauseated, try taking small sips of liquids. How do you know if you are getting enough fluid? Your urine should be a pale yellow or almost colorless. Get rest. Taking a steamy shower or using a humidifier may help nasal congestion and ease sore throat pain. Using a saline nasal spray works much the same way. Cough drops, hard candies and sore throat lozenges may ease your cough. Line up a caregiver. Have someone check on you regularly.  GET HELP RIGHT AWAY IF: You cannot  keep down liquids or your medications. You become short of breath Your fell like you are going to pass out or loose consciousness. Your symptoms persist after you have completed your treatment plan  MAKE SURE YOU  Understand these instructions. Will watch your condition. Will get help right away if you are not doing well or get worse.  Your e-visit answers were reviewed by a board certified advanced clinical practitioner to complete your personal care plan.  Depending on the condition, your plan could have included both over the counter or prescription medications.  If there is a problem please reply  once you have received a response from your provider.  Your safety is important to us .  If you have drug allergies check your prescription carefully.    You can use MyChart to ask questions about today's visit, request a non-urgent call back, or ask for a work or school excuse for 24 hours related to this e-Visit. If it has been greater than 24 hours you will need to follow up with your provider, or enter a new e-Visit to address those concerns.  You will get an e-mail in the next two days asking about your experience.  I hope that your e-visit has been valuable and will speed your recovery. Thank you for using e-visits.   I have spent 5 minutes in review of e-visit questionnaire, review and updating patient chart, medical decision making and response to patient.   Elsie Velma Lunger, PA-C

## 2024-06-01 ENCOUNTER — Telehealth: Admitting: Nurse Practitioner

## 2024-06-01 DIAGNOSIS — R3989 Other symptoms and signs involving the genitourinary system: Secondary | ICD-10-CM

## 2024-06-01 MED ORDER — CEPHALEXIN 500 MG PO CAPS: 500.0000 mg | ORAL_CAPSULE | Freq: Two times a day (BID) | ORAL | 0 refills | Status: AC

## 2024-06-01 NOTE — Progress Notes (Signed)

## 2024-06-13 ENCOUNTER — Inpatient Hospital Stay: Attending: Internal Medicine

## 2024-06-23 ENCOUNTER — Encounter: Payer: Self-pay | Admitting: Internal Medicine

## 2024-06-28 ENCOUNTER — Ambulatory Visit (INDEPENDENT_AMBULATORY_CARE_PROVIDER_SITE_OTHER)

## 2024-06-28 ENCOUNTER — Ambulatory Visit: Admitting: Podiatry

## 2024-06-28 DIAGNOSIS — M7752 Other enthesopathy of left foot: Secondary | ICD-10-CM | POA: Diagnosis not present

## 2024-06-28 NOTE — Progress Notes (Signed)
" ° °  No chief complaint on file.   HPI: 39 y.o. femalepresenting for evaluation of suspected toe fracture to the 4th and 5th toe of the left foot.  She ran over her foot with a garbage bin about 1 week ago.  She was seen in [facility] and diagnosed with suspected hairline fractures of the toes.  Since the injury it has improved significantly.  Presenting today wearing a short cam boot  Past Medical History:  Diagnosis Date   Allergy     Anemia    Anxiety    Arthritis    Back/hip   B12 deficiency    Cancer (HCC)    Cervical dysplasia 2007   LEEP   Colitis    Depression    Diabetes mellitus without complication (HCC)    Iron  deficiency    Migraine    1x/mo    Past Surgical History:  Procedure Laterality Date   DILATION AND CURETTAGE OF UTERUS     HERNIA REPAIR     LEEP  2007   TUBAL LIGATION      Allergies[1]   Physical Exam: General: The patient is alert and oriented x3 in no acute distress.  Dermatology: Skin is warm, dry and supple bilateral lower extremities.   Vascular: Palpable pedal pulses bilaterally. Capillary refill within normal limits.  No edema.  There is some mild ecchymosis noted around the 4th and 5th MTP of the left foot  Neurological: Grossly intact via light touch  Musculoskeletal Exam: No pedal deformities noted.  Toes in rectus alignment.  There is some tenderness diffusely throughout the distal lateral aspect of the forefoot  Radiographic Exam LT foot 06/28/2024:  Normal osseous mineralization. Joint spaces preserved.  No fractures or irregularities noted.  Impression: Normal exam  Assessment/Plan of Care: 1.  Possible hairline fractures around the 4th and 5th toes left foot  - Patient evaluated.  X-rays reviewed.  Personally I do not see any obvious indication of stress fracture or irregularities -Postop shoe dispensed.  Discontinue cam boot -Expect that the foot should heal and resolve uneventfully -Return to clinic PRN     Thresa EMERSON Sar,  DPM [facility]  Dr. Thresa EMERSON Sar, DPM    [address] [place], [place] [address]                Office [phone]  Fax [phone]        [1]  Allergies Allergen Reactions   Benadryl [Diphenhydramine] Hives   Ibuprofen  Hives   Lactose Intolerance (Gi) Diarrhea   "

## 2024-07-18 ENCOUNTER — Ambulatory Visit: Admit: 2024-07-18 | Admitting: Gastroenterology

## 2024-07-18 SURGERY — COLONOSCOPY
Anesthesia: General

## 2024-09-06 ENCOUNTER — Inpatient Hospital Stay

## 2024-09-06 ENCOUNTER — Inpatient Hospital Stay: Admitting: Internal Medicine
